# Patient Record
Sex: Male | Born: 1955 | Race: White | Hispanic: No | Smoking: Never smoker
Health system: Southern US, Community
[De-identification: ages and names within clinical notes are randomized; demographics above are authoritative.]

## PROBLEM LIST (undated history)

## (undated) DIAGNOSIS — K648 Other hemorrhoids: Principal | ICD-10-CM

## (undated) DIAGNOSIS — K644 Residual hemorrhoidal skin tags: Principal | ICD-10-CM

## (undated) DIAGNOSIS — E785 Hyperlipidemia, unspecified: Secondary | ICD-10-CM

## (undated) HISTORY — DX: Other hemorrhoids: K64.8

## (undated) HISTORY — DX: Residual hemorrhoidal skin tags: K64.4

---

## 2006-07-06 ENCOUNTER — Encounter: Admission: RE | Admit: 2006-07-06 | Discharge: 2006-07-06 | Payer: Self-pay | Admitting: Internal Medicine

## 2007-08-09 ENCOUNTER — Ambulatory Visit: Payer: Self-pay | Admitting: Internal Medicine

## 2007-08-30 ENCOUNTER — Ambulatory Visit: Payer: Self-pay | Admitting: Internal Medicine

## 2007-08-30 HISTORY — PX: COLONOSCOPY: SHX174

## 2012-12-13 ENCOUNTER — Emergency Department (HOSPITAL_COMMUNITY)
Admission: EM | Admit: 2012-12-13 | Discharge: 2012-12-13 | Disposition: A | Discharge status: Home / Self Care | Payer: BC Managed Care – PPO | Attending: Emergency Medicine | Admitting: Emergency Medicine

## 2012-12-13 ENCOUNTER — Emergency Department (HOSPITAL_COMMUNITY): Payer: BC Managed Care – PPO

## 2012-12-13 ENCOUNTER — Encounter (HOSPITAL_COMMUNITY): Payer: Self-pay | Admitting: Emergency Medicine

## 2012-12-13 DIAGNOSIS — S81009A Unspecified open wound, unspecified knee, initial encounter: Secondary | ICD-10-CM | POA: Insufficient documentation

## 2012-12-13 DIAGNOSIS — Y9389 Activity, other specified: Secondary | ICD-10-CM | POA: Insufficient documentation

## 2012-12-13 DIAGNOSIS — Z79899 Other long term (current) drug therapy: Secondary | ICD-10-CM | POA: Insufficient documentation

## 2012-12-13 DIAGNOSIS — Y929 Unspecified place or not applicable: Secondary | ICD-10-CM | POA: Insufficient documentation

## 2012-12-13 DIAGNOSIS — S91009A Unspecified open wound, unspecified ankle, initial encounter: Secondary | ICD-10-CM | POA: Insufficient documentation

## 2012-12-13 DIAGNOSIS — Z23 Encounter for immunization: Secondary | ICD-10-CM | POA: Insufficient documentation

## 2012-12-13 DIAGNOSIS — IMO0002 Reserved for concepts with insufficient information to code with codable children: Secondary | ICD-10-CM | POA: Insufficient documentation

## 2012-12-13 DIAGNOSIS — E785 Hyperlipidemia, unspecified: Secondary | ICD-10-CM | POA: Insufficient documentation

## 2012-12-13 HISTORY — DX: Hyperlipidemia, unspecified: E78.5

## 2012-12-13 MED ORDER — CEPHALEXIN 500 MG PO CAPS: 500.0000 mg | ORAL_CAPSULE | Freq: Four times a day (QID) | ORAL | Status: DC

## 2012-12-13 MED ORDER — SULFAMETHOXAZOLE-TRIMETHOPRIM 800-160 MG PO TABS: 1.0000 | ORAL_TABLET | Freq: Two times a day (BID) | ORAL | Status: DC

## 2012-12-13 MED ORDER — TETANUS-DIPHTH-ACELL PERTUSSIS 5-2.5-18.5 LF-MCG/0.5 IM SUSP
0.5000 mL | Freq: Once | INTRAMUSCULAR | Status: AC
  Administered 2012-12-13: 0.5 mL via INTRAMUSCULAR
  Filled 2012-12-13: qty 0.5

## 2012-12-13 NOTE — ED Notes (Signed)
Patient states that he was working with a chainsaw and he cut his left knee. The patient has a 2 inch laceration to his left knee. The bleeding is controlled

## 2012-12-13 NOTE — ED Provider Notes (Signed)
History    CSN: [id number] Arrival date & time 12/13/12  1220  First MD Initiated Contact with Patient 12/13/12 1402     Chief Complaint  Patient presents with  . Laceration   (Consider location/radiation/quality/duration/timing/severity/associated sxs/prior Treatment) Patient is a 57 y.o. male presenting with skin laceration. The history is provided by the patient. No language interpreter was used.  Laceration Reginald Gill is a 57 y/o M with PMHx of HLD presenting to the ED with laceration to the left knee that occurred today at approximately 11:45AM, patient reported that he was using a chainsaw and he was leaning over with the chainsaw still on and the chainsaw kicked back leading to a laceration to the left knee. Patient reported mild discomfort, 3/10, pain described as a constant ache, without radiation. Bleeding controlled. Denied use of medications. Denied numbness, tingling.   Past Medical History  Diagnosis Date  . Hyperlipidemia    History reviewed. No pertinent past surgical history. History reviewed. No pertinent family history. History  Substance Use Topics  . Smoking status: Never Smoker   . Smokeless tobacco: Not on file  . Alcohol Use: Yes     Comment: occasionally    Review of Systems  Constitutional: Negative for fever and chills.  HENT: Negative for neck pain.   Skin: Positive for wound.  Neurological: Negative for dizziness, weakness and numbness.  All other systems reviewed and are negative.    Allergies  Review of patient's allergies indicates no known allergies.  Home Medications   Current Outpatient Rx  Name  Route  Sig  Dispense  Refill  . calcium-vitamin D (OSCAL WITH D) 500-200 MG-UNIT per tablet   Oral   Take 1 tablet by mouth daily.         . Multiple Vitamin (MULTIVITAMIN WITH MINERALS) TABS   Oral   Take 1 tablet by mouth daily.         . rosuvastatin (CRESTOR) 5 MG tablet   Oral   Take 5 mg by mouth 4 (four) times a  week.         . cephALEXin (KEFLEX) 500 MG capsule   Oral   Take 1 capsule (500 mg total) by mouth 4 (four) times daily.   20 capsule   0   . sulfamethoxazole-trimethoprim (BACTRIM DS,SEPTRA DS) 800-160 MG per tablet   Oral   Take 1 tablet by mouth 2 (two) times daily. One po bid x 7 days   14 tablet   0    BP 122/83  Pulse 57  Temp(Src) 98.5 F (36.9 C) (Oral)  Resp 15  SpO2 100% Physical Exam  Nursing note and vitals reviewed. Constitutional: He is oriented to person, place, and time. He appears well-developed and well-nourished. No distress.  HENT:  Head: Normocephalic and atraumatic.  Eyes:  Eyes normal  Neck: Normal range of motion. Neck supple.  Cardiovascular:  Pulses:      Dorsalis pedis pulses are 2+ on the right side, and 2+ on the left side.  Musculoskeletal: Normal range of motion. He exhibits tenderness (mild discomfort to palpation of the anterior aspect of the left knee secondary to laceration). He exhibits no edema.  Full flexion and extension of left knee - negative pain  Strength 5+/5+ to lower extremities bilaterally, with resistance.   Neurological: He is alert and oriented to person, place, and time. He exhibits normal muscle tone. Coordination normal.  Sensation to lower extremities, bilaterally, with differentiation to sharp and dull touch.  Skin: Skin is warm and dry. No rash noted. He is not diaphoretic. No erythema.  Approximately, 2-2.5 inch laceration to the left knee  Psychiatric: He has a normal mood and affect. His behavior is normal. Thought content normal.    ED Course  Procedures (including critical care time) LACERATION REPAIR Performed by: Raymon Mutton Authorized by: Raymon Mutton Consent: Verbal consent obtained. Risks and benefits: risks, benefits and alternatives were discussed Consent given by: patient Patient identity confirmed: provided demographic data Prepped and Draped in normal sterile fashion Wound  explored  Laceration Location: left knee  Laceration Length: 2 -2.5 inches  No Foreign Bodies seen or palpated  Anesthesia: local infiltration  Local anesthetic: lidocaine 2% without epinephrine  Anesthetic total: 6 ml  Irrigation method: syringe Amount of cleaning: standard  Skin closure: approximate  Number of sutures: 6  Technique: horizontal mattress, 3-0 prolene  Patient tolerance: Patient tolerated the procedure well with no immediate complications. No foreign bodies noted. Wound cleaned thoroughly and thoroughly irrigated. Mid debridement required for removal of damaged tissues.    Labs Reviewed - No data to display Dg Knee Complete 4 Views Left  12/13/2012   *RADIOLOGY REPORT*  Clinical Data: Laceration left anterior knee.  LEFT KNEE - COMPLETE 4+ VIEW  Comparison: None.  Findings: Infrapatellar bandaging noted.  Skin defect anterior to the inferior pole of the patella likely represents the described laceration.  No patella although or knee effusion.  No disruption of fat planes between Hoffa's fat pad and the patellar tendon.  No other foreign body or bony abnormality.  IMPRESSION:  1.  Laceration anterior to the patellar tendon.  Aside from the bandaging, I do not see a foreign body.  No acute bony findings or knee effusion.   Original Report Authenticated By: Gaylyn Rong, M.D.   1. Laceration     MDM  Patient presenting to the ED with laceration to the left knee secondary to chainsaw use. Xray negative for foreign body. Tetanus booster administered. Negative neurovascular damage noted. Full flexion and extension of left knee without pain. Closed with 6 horizontal mattress sutures using 3-0 prolene - patient tolerated procedure well. Thoroughly cleaned and irrigated. Negative foreign body noted, negative involvement of tendon and deep tendons, proper hemostasis noted. Patient discharged. Discharged patient with antibiotics. Discussed with patient wound care.  Discussed with patient to return to ED within 7 days for removal of sutures. Discussed with patient to refrain from strenuous activity for this could aggravate the sutures. Discussed with patient to continue to monitor symptoms and if symptoms are to worsen or change to report back to the ED - strict return instructions given. Patient agreed to plan of care, understood, all questions answered.   Raymon Mutton, PA-C 12/13/12 1749

## 2012-12-17 NOTE — ED Provider Notes (Signed)
Medical screening examination/treatment/procedure(s) were performed by non-physician practitioner and as supervising physician I was immediately available for consultation/collaboration.   Richardean Canal, MD 12/17/12 [phone]

## 2012-12-21 ENCOUNTER — Encounter (HOSPITAL_COMMUNITY): Payer: Self-pay

## 2012-12-21 ENCOUNTER — Emergency Department (HOSPITAL_COMMUNITY)
Admission: EM | Admit: 2012-12-21 | Discharge: 2012-12-21 | Disposition: A | Discharge status: Home / Self Care | Payer: BC Managed Care – PPO | Attending: Emergency Medicine | Admitting: Emergency Medicine

## 2012-12-21 DIAGNOSIS — Z792 Long term (current) use of antibiotics: Secondary | ICD-10-CM | POA: Insufficient documentation

## 2012-12-21 DIAGNOSIS — Z4802 Encounter for removal of sutures: Secondary | ICD-10-CM | POA: Insufficient documentation

## 2012-12-21 DIAGNOSIS — E785 Hyperlipidemia, unspecified: Secondary | ICD-10-CM | POA: Insufficient documentation

## 2012-12-21 DIAGNOSIS — Z79899 Other long term (current) drug therapy: Secondary | ICD-10-CM | POA: Insufficient documentation

## 2012-12-21 NOTE — ED Provider Notes (Signed)
   History    CSN: [id number] Arrival date & time 12/21/12  4098  First MD Initiated Contact with Patient 12/21/12 [phone]     Chief Complaint  Patient presents with  . Suture / Staple Removal  suture removal (Consider location/radiation/quality/duration/timing/severity/associated sxs/prior Treatment) HPI Here for planned left knee lac suture removal; no concerns; no fever/redness/pain/pus/weak/numb/dehiscence. Past Medical History  Diagnosis Date  . Hyperlipidemia    History reviewed. No pertinent past surgical history. History reviewed. No pertinent family history. History  Substance Use Topics  . Smoking status: Never Smoker   . Smokeless tobacco: Not on file  . Alcohol Use: Yes     Comment: occasionally    Review of Systems See HPI Allergies  Review of patient's allergies indicates no known allergies.  Home Medications   Current Outpatient Rx  Name  Route  Sig  Dispense  Refill  . calcium-vitamin D (OSCAL WITH D) 500-200 MG-UNIT per tablet   Oral   Take 1 tablet by mouth daily.         . cephALEXin (KEFLEX) 500 MG capsule   Oral   Take 1 capsule (500 mg total) by mouth 4 (four) times daily.   20 capsule   0   . Multiple Vitamin (MULTIVITAMIN WITH MINERALS) TABS   Oral   Take 1 tablet by mouth daily.         . rosuvastatin (CRESTOR) 5 MG tablet   Oral   Take 5 mg by mouth 4 (four) times a week.         . sulfamethoxazole-trimethoprim (BACTRIM DS,SEPTRA DS) 800-160 MG per tablet   Oral   Take 1 tablet by mouth 2 (two) times daily. One po bid x 7 days   14 tablet   0    BP 110/64  Pulse 68  Temp(Src) 97.8 F (36.6 C) (Oral)  Resp 16  SpO2 100% Physical Exam Awake, alert nontoxic, normal speech and gait. Left knee well-healing lac no tenderness/pus/erythema/dehiscence, good ROM left knee, normal LT left leg ED Course  Procedures (including critical care time) RN removed sutures. Labs Reviewed - No data to display No results found. 1. Visit  for suture removal     MDM  Doubt wound infection.  Hurman Horn, MD 12/21/12 2219

## 2012-12-21 NOTE — ED Notes (Signed)
He states he is here for suture removal (from left ant. Knee).  Wound is clean and healing.

## 2013-04-29 ENCOUNTER — Encounter: Payer: Self-pay | Admitting: Internal Medicine

## 2013-06-03 ENCOUNTER — Encounter: Payer: Self-pay | Admitting: Internal Medicine

## 2013-06-03 ENCOUNTER — Ambulatory Visit (INDEPENDENT_AMBULATORY_CARE_PROVIDER_SITE_OTHER): Payer: BC Managed Care – PPO | Admitting: Internal Medicine

## 2013-06-03 VITALS — BP 102/70 | HR 76 | Ht 72.0 in | Wt 175.2 lb

## 2013-06-03 DIAGNOSIS — R195 Other fecal abnormalities: Secondary | ICD-10-CM | POA: Insufficient documentation

## 2013-06-03 MED ORDER — NA SULFATE-K SULFATE-MG SULF 17.5-3.13-1.6 GM/177ML PO SOLN: ORAL | Status: DC

## 2013-06-03 NOTE — Assessment & Plan Note (Signed)
This is occurring 5 years after a normal colonoscopy. Repeat colonoscopy is prudent.The risks and benefits as well as alternatives of endoscopic procedure(s) have been discussed and reviewed. All questions answered. The patient agrees to proceed.

## 2013-06-03 NOTE — Progress Notes (Addendum)
         Subjective:    Patient ID: Reginald Gill, male    DOB: 13-Dec-1955, 57 y.o.   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 In 2009 a colonoscopy was normal. He has no GI symptoms at this time. No Known Allergies Outpatient Prescriptions Prior to Visit  Medication Sig Dispense Refill  . calcium-vitamin D (OSCAL WITH D) 500-200 MG-UNIT per tablet Take 1 tablet by mouth daily.      . Multiple Vitamin (MULTIVITAMIN WITH MINERALS) TABS Take 1 tablet by mouth daily.      . rosuvastatin (CRESTOR) 5 MG tablet Take 5 mg by mouth 4 (four) times a week.      . cephALEXin (KEFLEX) 500 MG capsule Take 1 capsule (500 mg total) by mouth 4 (four) times daily.  20 capsule  0  . sulfamethoxazole-trimethoprim (BACTRIM DS,SEPTRA DS) 800-160 MG per tablet Take 1 tablet by mouth 2 (two) times daily. One po bid x 7 days  14 tablet  0   No facility-administered medications prior to visit.   Past Medical History  Diagnosis Date  . Hyperlipidemia    Past Surgical History  Procedure Laterality Date  . Colonoscopy  08/30/2007    Dr. Stan Head   History   Social History  . Marital Status: Married    Spouse Name: Huntley Dec    Number of Children: N0  . Years of Education: DDS   Social History Main Topics  . Smoking status: Never Smoker   . Smokeless tobacco: Never Used  . Alcohol Use: Yes     Comment: occasionally  . 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   No caffeine.   Family History  Problem Relation Age of Onset  . Ovarian cancer Mother     Review of Systems Entirely negative.    Objective:   Physical Exam Well-nourished no acute distress  Data review labs from 03/28/2013 show a normal CBC with hemoglobin 16.2 and MCV of 96.1. Comprehensive metabolic panel is normal. TSH normal PSA normal.    Assessment & Plan:   1. Heme + stool-iFOBT    I appreciate the opportunity  to care for this patient. CC: Ezequiel Kayser, MD

## 2013-06-03 NOTE — Patient Instructions (Signed)

## 2013-06-04 ENCOUNTER — Encounter: Payer: Self-pay | Admitting: Internal Medicine

## 2013-06-18 ENCOUNTER — Encounter: Payer: Self-pay | Admitting: Internal Medicine

## 2013-06-18 ENCOUNTER — Ambulatory Visit (AMBULATORY_SURGERY_CENTER): Payer: BC Managed Care – PPO | Admitting: Internal Medicine

## 2013-06-18 VITALS — BP 126/74 | HR 59 | Temp 96.9°F | Resp 36 | Ht 72.0 in | Wt 175.0 lb

## 2013-06-18 DIAGNOSIS — R195 Other fecal abnormalities: Secondary | ICD-10-CM

## 2013-06-18 MED ORDER — SODIUM CHLORIDE 0.9 % IV SOLN: 500.0000 mL | INTRAVENOUS | Status: DC

## 2013-06-18 NOTE — Patient Instructions (Addendum)
The colonoscopy was normal. I suspect some anorectal irritation caused the heme + stool.  Prostate was normal.  Next routine colonoscopy in 10 years - 2024. I would avoid routine hemoccults for at least 5 years if not 10.  I appreciate the opportunity to care for you. Iva Boop, MD, FACG  YOU HAD AN ENDOSCOPIC PROCEDURE TODAY AT THE [facility]: Refer to the procedure report that was given to you for any specific questions about what was found during the examination.  If the procedure report does not answer your questions, please call your gastroenterologist to clarify.  If you requested that your care partner not be given the details of your procedure findings, then the procedure report has been included in a sealed envelope for you to review at your convenience later.  YOU SHOULD EXPECT: Some feelings of bloating in the abdomen. Passage of more gas than usual.  Walking can help get rid of the air that was put into your GI tract during the procedure and reduce the bloating. If you had a lower endoscopy (such as a colonoscopy or flexible sigmoidoscopy) you may notice spotting of blood in your stool or on the toilet paper. If you underwent a bowel prep for your procedure, then you may not have a normal bowel movement for a few days.  DIET: Your first meal following the procedure should be a light meal and then it is ok to progress to your normal diet.  A half-sandwich or bowl of soup is an example of a good first meal.  Heavy or fried foods are harder to digest and may make you feel nauseous or bloated.  Likewise meals heavy in dairy and vegetables can cause extra gas to form and this can also increase the bloating.  Drink plenty of fluids but you should avoid alcoholic beverages for 24 hours.  ACTIVITY: Your care partner should take you home directly after the procedure.  You should plan to take it easy, moving slowly for the rest of the day.  You can resume normal activity the day  after the procedure however you should NOT DRIVE or use heavy machinery for 24 hours (because of the sedation medicines used during the test).    SYMPTOMS TO REPORT IMMEDIATELY: A gastroenterologist can be reached at any hour.  During normal business hours, 8:30 AM to 5:00 PM Monday through Friday, call [phone].  After hours and on weekends, please call the GI answering service at [phone] who will take a message and have the physician on call contact you.   Following lower endoscopy (colonoscopy or flexible sigmoidoscopy):  Excessive amounts of blood in the stool  Significant tenderness or worsening of abdominal pains  Swelling of the abdomen that is new, acute  Fever of 100F or higher  Following upper endoscopy (EGD)  Vomiting of blood or coffee ground material  New chest pain or pain under the shoulder blades  Painful or persistently difficult swallowing  New shortness of breath  Fever of 100F or higher  Black, tarry-looking stools  FOLLOW UP: If any biopsies were taken you will be contacted by phone or by letter within the next 1-3 weeks.  Call your gastroenterologist if you have not heard about the biopsies in 3 weeks.  Our staff will call the home number listed on your records the next business day following your procedure to check on you and address any questions or concerns that you may have at that time regarding the information  given to you following your procedure. This is a courtesy call and so if there is no answer at the home number and we have not heard from you through the emergency physician on call, we will assume that you have returned to your regular daily activities without incident.  SIGNATURES/CONFIDENTIALITY: You and/or your care partner have signed paperwork which will be entered into your electronic medical record.  These signatures attest to the fact that that the information above on your After Visit Summary has been reviewed and is understood.   Full responsibility of the confidentiality of this discharge information lies with you and/or your care-partner.

## 2013-06-18 NOTE — Progress Notes (Signed)
Stable to RR 

## 2013-06-18 NOTE — Op Note (Signed)
[facility] [address] [place], [address]   COLONOSCOPY PROCEDURE REPORT  PATIENT: Reginald Gill, Reginald Gill  MR#: [id number] BIRTHDATE: 1955/09/13 , 57  yrs. old GENDER: Male ENDOSCOPIST: Iva Boop, MD, [facility] PROCEDURE DATE:  06/18/2013 PROCEDURE:   Colonoscopy, diagnostic First Screening Colonoscopy - Avg.  risk and is 50 yrs.  old or older - No.  Prior Negative Screening - Now for repeat screening. Other: See Comments  History of Adenoma - Now for follow-up colonoscopy & has been > or = to 3 yrs.  N/A  Polyps Removed Today? No.  Recommend repeat exam, <10 yrs? No. ASA CLASS:   Class I INDICATIONS:heme-positive stool. MEDICATIONS: propofol (Diprivan) 300mg  IV, MAC sedation, administered by CRNA, and These medications were titrated to patient response per physician's verbal order  DESCRIPTION OF PROCEDURE:   After the risks benefits and alternatives of the procedure were thoroughly explained, informed consent was obtained.  A digital rectal exam revealed no abnormalities of the rectum, A digital rectal exam revealed no prostatic nodules, and A digital rectal exam revealed the prostate was not enlarged.   The LB [id number]  endoscope was introduced through the anus and advanced to the cecum, which was identified by both the appendix and ileocecal valve. No adverse events experienced.   The quality of the prep was excellent using Suprep  The instrument was then slowly withdrawn as the colon was fully examined.      COLON FINDINGS: A normal appearing cecum, ileocecal valve, and appendiceal orifice were identified.  The ascending, hepatic flexure, transverse, splenic flexure, descending, sigmoid colon and rectum appeared unremarkable.  No polyps or cancers were seen.   A right colon retroflexion was performed.  Retroflexed views revealed no abnormalities. The time to cecum=3 minutes 11 seconds. Withdrawal time=11 minutes 10 seconds.  The scope was  withdrawn and the procedure completed. COMPLICATIONS: There were no complications.  ENDOSCOPIC IMPRESSION: Normal colonoscopy with excellent prep. Normal prostate exam  RECOMMENDATIONS: Repeat colonoscopy 10 years - 2024   eSigned:  Iva Boop, MD, [facility] 06/18/2013 8:42 AM   cc: Rodrigo Ran, MD and The Patient

## 2013-06-20 ENCOUNTER — Telehealth: Payer: Self-pay | Admitting: *Deleted

## 2013-06-20 NOTE — Telephone Encounter (Signed)
No answer, unable to leave a message. °

## 2013-06-20 NOTE — Telephone Encounter (Signed)
No answer, message left for the patient. 

## 2015-10-08 DIAGNOSIS — H40013 Open angle with borderline findings, low risk, bilateral: Secondary | ICD-10-CM | POA: Diagnosis not present

## 2015-10-08 DIAGNOSIS — H11153 Pinguecula, bilateral: Secondary | ICD-10-CM | POA: Diagnosis not present

## 2015-10-08 DIAGNOSIS — H21233 Degeneration of iris (pigmentary), bilateral: Secondary | ICD-10-CM | POA: Diagnosis not present

## 2015-12-08 DIAGNOSIS — E784 Other hyperlipidemia: Secondary | ICD-10-CM | POA: Diagnosis not present

## 2015-12-08 DIAGNOSIS — E785 Hyperlipidemia, unspecified: Secondary | ICD-10-CM | POA: Diagnosis not present

## 2016-03-22 DIAGNOSIS — Z23 Encounter for immunization: Secondary | ICD-10-CM | POA: Diagnosis not present

## 2016-05-30 DIAGNOSIS — D1801 Hemangioma of skin and subcutaneous tissue: Secondary | ICD-10-CM | POA: Diagnosis not present

## 2016-05-30 DIAGNOSIS — D2362 Other benign neoplasm of skin of left upper limb, including shoulder: Secondary | ICD-10-CM | POA: Diagnosis not present

## 2016-05-30 DIAGNOSIS — Z85828 Personal history of other malignant neoplasm of skin: Secondary | ICD-10-CM | POA: Diagnosis not present

## 2016-05-30 DIAGNOSIS — D2261 Melanocytic nevi of right upper limb, including shoulder: Secondary | ICD-10-CM | POA: Diagnosis not present

## 2016-05-31 DIAGNOSIS — M5416 Radiculopathy, lumbar region: Secondary | ICD-10-CM | POA: Diagnosis not present

## 2016-06-05 DIAGNOSIS — M545 Low back pain: Secondary | ICD-10-CM | POA: Diagnosis not present

## 2016-06-07 DIAGNOSIS — M5416 Radiculopathy, lumbar region: Secondary | ICD-10-CM | POA: Diagnosis not present

## 2016-06-14 DIAGNOSIS — M5416 Radiculopathy, lumbar region: Secondary | ICD-10-CM | POA: Diagnosis not present

## 2016-06-21 DIAGNOSIS — M5416 Radiculopathy, lumbar region: Secondary | ICD-10-CM | POA: Diagnosis not present

## 2016-06-23 DIAGNOSIS — Z0000 Encounter for general adult medical examination without abnormal findings: Secondary | ICD-10-CM | POA: Diagnosis not present

## 2016-06-23 DIAGNOSIS — Z125 Encounter for screening for malignant neoplasm of prostate: Secondary | ICD-10-CM | POA: Diagnosis not present

## 2016-06-23 DIAGNOSIS — R946 Abnormal results of thyroid function studies: Secondary | ICD-10-CM | POA: Diagnosis not present

## 2016-06-23 DIAGNOSIS — E784 Other hyperlipidemia: Secondary | ICD-10-CM | POA: Diagnosis not present

## 2016-06-28 DIAGNOSIS — M5416 Radiculopathy, lumbar region: Secondary | ICD-10-CM | POA: Diagnosis not present

## 2016-06-30 DIAGNOSIS — R946 Abnormal results of thyroid function studies: Secondary | ICD-10-CM | POA: Diagnosis not present

## 2016-06-30 DIAGNOSIS — E784 Other hyperlipidemia: Secondary | ICD-10-CM | POA: Diagnosis not present

## 2016-06-30 DIAGNOSIS — Z0000 Encounter for general adult medical examination without abnormal findings: Secondary | ICD-10-CM | POA: Diagnosis not present

## 2016-06-30 DIAGNOSIS — Z1389 Encounter for screening for other disorder: Secondary | ICD-10-CM | POA: Diagnosis not present

## 2016-06-30 DIAGNOSIS — C4491 Basal cell carcinoma of skin, unspecified: Secondary | ICD-10-CM | POA: Diagnosis not present

## 2016-06-30 DIAGNOSIS — M545 Low back pain: Secondary | ICD-10-CM | POA: Diagnosis not present

## 2016-07-24 DIAGNOSIS — L570 Actinic keratosis: Secondary | ICD-10-CM | POA: Diagnosis not present

## 2017-02-26 ENCOUNTER — Telehealth: Payer: Self-pay | Admitting: Internal Medicine

## 2017-02-26 NOTE — Telephone Encounter (Signed)
Subject: Non-Urgent Medical Question             Reginald Gill,    Reginald Gill writing not Reginald Gill. I have not signed up for my chart yet. The past 4 days (since Friday morning), after a bowel movement I have noticed a slight amount of red blood on the toilet paper after a wipe. There was a very small amount on a portion of the stool also. I have take naproxen 220 mg tablets and occasionally a ASA 325mg  tablet periodically over the past week or so for lower back pain.  Maximum naproxen daily dose was four tablets(usually two tablets).  Please advise me on next course of action (office visit, PA is fine with me)? Contact # either [phone](cell) or [phone] (home)    Thanks for your help.    Reginald Gill   Call end explain I think he is having benign anorectal bleeding - could be aggravated by NSAID's  Should stop those if he can though if he needs them for his back ok with this level of bleeding.  Please Rx hydrocortisone cream 2.5% # 30 g use bid x 7 days - I suspect he has hemorrhoids bleeding.  We can get him an appointment w/ me or an app in next 1-2 weeks - if we have a soon appointment could wait until he is seen to Rx I.e. If he is coming in next 3 days would not Rx  Thanks

## 2017-02-26 NOTE — Telephone Encounter (Signed)
Patient notified  He will come in on Wed at 10:30.  Will hold off on RX

## 2017-02-26 NOTE — Telephone Encounter (Signed)
Left message for patient to call back  

## 2017-02-28 ENCOUNTER — Encounter: Payer: Self-pay | Admitting: Internal Medicine

## 2017-02-28 ENCOUNTER — Ambulatory Visit (INDEPENDENT_AMBULATORY_CARE_PROVIDER_SITE_OTHER): Payer: Self-pay | Admitting: Internal Medicine

## 2017-02-28 VITALS — BP 100/70 | HR 60 | Ht 71.5 in | Wt 165.6 lb

## 2017-02-28 DIAGNOSIS — K644 Residual hemorrhoidal skin tags: Secondary | ICD-10-CM | POA: Insufficient documentation

## 2017-02-28 DIAGNOSIS — K648 Other hemorrhoids: Secondary | ICD-10-CM

## 2017-02-28 HISTORY — DX: Residual hemorrhoidal skin tags: K64.4

## 2017-02-28 MED ORDER — HYDROCORTISONE 2.5 % RE CREA: 1.0000 "application " | TOPICAL_CREAM | Freq: Two times a day (BID) | RECTAL | 1 refills | Status: DC | PRN

## 2017-02-28 NOTE — Patient Instructions (Signed)
   You have bleeding hemorrhoids.  Try the hydrocortisone cream as needed.  Call back as needed.  I appreciate the opportunity to care for you. Iva Booparl E. Elyshia Kumagai, MD, Clementeen GrahamFACG

## 2017-02-28 NOTE — Progress Notes (Signed)
   Reginald CallasDavid Gill 60 y.o. 11/04/1955 [id number]  Assessment & Plan:   Encounter Diagnosis  Name Primary?  . Internal and external bleeding hemorrhoids Yes    We'll treat with as needed hydrocortisone cream. Why these flared is not entirely clear. Does not seem to need to do anything for bowel habits etc. I will see him back as needed.  I appreciate the opportunity to care for this patient. 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 Lately he has seen some bright red blood with wiping. No rectal pain no change in bowel habits in fact defecation is easy and without difficulty. Colonoscopy in 2014 was normal as was one in 2009. 2014 exam was because of a heme positive stool found on screening testing. No Known Allergies Current Meds  Medication Sig  . Multiple Vitamin (MULTIVITAMIN WITH MINERALS) TABS Take 1 tablet by mouth daily.  . naproxen (NAPROSYN) 250 MG tablet Take 250 mg by mouth as needed.  . rosuvastatin (CRESTOR) 5 MG tablet Take 5 mg by mouth 4 (four) times a week.   Past Medical History:  Diagnosis Date  . Hyperlipidemia    Past Surgical History:  Procedure Laterality Date  . COLONOSCOPY  08/30/2007   Dr. Stan Headarl Gessner   Review of Systems As per history of present illness  Objective:   Physical Exam  Rectal Normal tone no mass brown stool nontender Anoscopy Grade 1 internal and external hemorrhoids all positions with mild inflammation changes i.e. erythematous

## 2017-03-12 DIAGNOSIS — Z23 Encounter for immunization: Secondary | ICD-10-CM | POA: Diagnosis not present

## 2017-05-29 DIAGNOSIS — D1801 Hemangioma of skin and subcutaneous tissue: Secondary | ICD-10-CM | POA: Diagnosis not present

## 2017-05-29 DIAGNOSIS — L812 Freckles: Secondary | ICD-10-CM | POA: Diagnosis not present

## 2017-05-29 DIAGNOSIS — Z85828 Personal history of other malignant neoplasm of skin: Secondary | ICD-10-CM | POA: Diagnosis not present

## 2017-05-29 DIAGNOSIS — L821 Other seborrheic keratosis: Secondary | ICD-10-CM | POA: Diagnosis not present

## 2017-06-22 DIAGNOSIS — M25562 Pain in left knee: Secondary | ICD-10-CM | POA: Diagnosis not present

## 2017-07-04 DIAGNOSIS — M25562 Pain in left knee: Secondary | ICD-10-CM | POA: Diagnosis not present

## 2017-07-05 ENCOUNTER — Emergency Department (HOSPITAL_COMMUNITY): Payer: BLUE CROSS/BLUE SHIELD

## 2017-07-05 ENCOUNTER — Encounter (HOSPITAL_COMMUNITY)
Admission: EM | Disposition: A | Discharge status: Home / Self Care | Payer: Self-pay | Source: Home / Self Care | Attending: Emergency Medicine

## 2017-07-05 ENCOUNTER — Ambulatory Visit (HOSPITAL_COMMUNITY)
Admission: EM | Admit: 2017-07-05 | Discharge: 2017-07-05 | Disposition: A | Discharge status: Home / Self Care | Payer: BLUE CROSS/BLUE SHIELD | Attending: Emergency Medicine | Admitting: Emergency Medicine

## 2017-07-05 ENCOUNTER — Encounter (HOSPITAL_COMMUNITY): Payer: Self-pay | Admitting: Emergency Medicine

## 2017-07-05 ENCOUNTER — Other Ambulatory Visit: Payer: Self-pay

## 2017-07-05 DIAGNOSIS — E785 Hyperlipidemia, unspecified: Secondary | ICD-10-CM | POA: Insufficient documentation

## 2017-07-05 DIAGNOSIS — Z8041 Family history of malignant neoplasm of ovary: Secondary | ICD-10-CM | POA: Diagnosis not present

## 2017-07-05 DIAGNOSIS — Z79899 Other long term (current) drug therapy: Secondary | ICD-10-CM | POA: Diagnosis not present

## 2017-07-05 DIAGNOSIS — T18108A Unspecified foreign body in esophagus causing other injury, initial encounter: Secondary | ICD-10-CM | POA: Diagnosis not present

## 2017-07-05 DIAGNOSIS — K2970 Gastritis, unspecified, without bleeding: Secondary | ICD-10-CM | POA: Insufficient documentation

## 2017-07-05 DIAGNOSIS — R0989 Other specified symptoms and signs involving the circulatory and respiratory systems: Secondary | ICD-10-CM | POA: Diagnosis not present

## 2017-07-05 DIAGNOSIS — Z8719 Personal history of other diseases of the digestive system: Secondary | ICD-10-CM | POA: Insufficient documentation

## 2017-07-05 DIAGNOSIS — Z9889 Other specified postprocedural states: Secondary | ICD-10-CM | POA: Diagnosis not present

## 2017-07-05 DIAGNOSIS — Z0389 Encounter for observation for other suspected diseases and conditions ruled out: Secondary | ICD-10-CM | POA: Diagnosis not present

## 2017-07-05 DIAGNOSIS — T189XXA Foreign body of alimentary tract, part unspecified, initial encounter: Secondary | ICD-10-CM | POA: Diagnosis not present

## 2017-07-05 HISTORY — PX: ESOPHAGOGASTRODUODENOSCOPY: SHX5428

## 2017-07-05 SURGERY — EGD (ESOPHAGOGASTRODUODENOSCOPY)
Anesthesia: Moderate Sedation

## 2017-07-05 MED ORDER — MIDAZOLAM HCL 5 MG/ML IJ SOLN
INTRAMUSCULAR | Status: AC
  Filled 2017-07-05: qty 2

## 2017-07-05 MED ORDER — BUTAMBEN-TETRACAINE-BENZOCAINE 2-2-14 % EX AERO
INHALATION_SPRAY | CUTANEOUS | Status: DC | PRN
  Administered 2017-07-05: 2 via TOPICAL

## 2017-07-05 MED ORDER — FENTANYL CITRATE (PF) 100 MCG/2ML IJ SOLN
INTRAMUSCULAR | Status: AC
  Filled 2017-07-05: qty 4

## 2017-07-05 MED ORDER — SODIUM CHLORIDE 0.9 % IV SOLN: Freq: Once | INTRAVENOUS | Status: DC

## 2017-07-05 MED ORDER — FENTANYL CITRATE (PF) 100 MCG/2ML IJ SOLN
INTRAMUSCULAR | Status: DC | PRN
  Administered 2017-07-05 (×2): 25 ug via INTRAVENOUS

## 2017-07-05 MED ORDER — MIDAZOLAM HCL 10 MG/2ML IJ SOLN
INTRAMUSCULAR | Status: DC | PRN
  Administered 2017-07-05 (×2): 2 mg via INTRAVENOUS

## 2017-07-05 NOTE — ED Notes (Signed)
ENT cart at bedside, unopened

## 2017-07-05 NOTE — Discharge Instructions (Signed)
It is likely that the toothpick scratched your esophagus causing your discomfort with swallowing.  There is no evidence of this foreign body in your stomach or esophagus on endoscopy today.  It is possible that it may have passed into your small intestine.  Usually swallowed foreign bodies cause little complications once passed into your intestines; however sharp foreign bodies do care a small risk of bowel perforation.  Should you develop worsening abdominal pain, fever, worsening nausea or vomiting, return to the emergency department for evaluation.  We advise the use of dental floss after meals.  Follow up with your primary care doctor as needed.  Return to the ED for any other new or concerning symptoms.

## 2017-07-05 NOTE — ED Notes (Signed)
ENT tech at bedside, fluids hung, patient placed on cardiac monitor.

## 2017-07-05 NOTE — ED Triage Notes (Signed)
Pt reports swallowing wooden toothpick, reports feeling it lodged in esophagus.

## 2017-07-05 NOTE — Consult Note (Signed)
Referring Provider: Bethann BerkshireJoseph Zammit Primary Care Physician:  Rodrigo RanPerini, Mark, MD Primary Gastroenterologist:  Dr. Leone PayorGessner  Reason for Consultation:  Possible esophageal foreign body  HPI: Reginald CallasDavid Gill is a 62 y.o. male with little PMH swallowed a toothpick this morning around 11 am.  He was playing with it in his mouth after lunch and accidentally swallowed it.  Feels it when he swallows now.  Has not had anything else to eat or drink since.  No pain. Just foreign body sensation near sternal notch.  He thinks he can feel something when he flexes his neck fully.  No dyspnea.  No cp.  No neck pain. No prior EGD.  2 past colonoscopies.  CXR in ER unremarkable   Past Medical History:  Diagnosis Date  . Hyperlipidemia   . Internal and external bleeding hemorrhoids 02/28/2017    Past Surgical History:  Procedure Laterality Date  . COLONOSCOPY  08/30/2007   Dr. Stan Headarl Gessner    Prior to Admission medications   Medication Sig Start Date End Date Taking? Authorizing Provider  Multiple Vitamin (MULTIVITAMIN WITH MINERALS) TABS Take 1 tablet by mouth daily.   Yes [provider]  naproxen (NAPROSYN) 250 MG tablet Take 250 mg by mouth as needed.   Yes [provider]  rosuvastatin (CRESTOR) 5 MG tablet Take 5 mg by mouth daily.    Yes [provider]  hydrocortisone (ANUSOL-HC) 2.5 % rectal cream Place 1 application rectally 2 (two) times daily as needed for hemorrhoids. Patient not taking: Reported on 07/05/2017 02/28/17   Iva BoopGessner, Carl E, MD    Current Facility-Administered Medications  Medication Dose Route Frequency Provider Last Rate Last Dose  . 0.9 %  sodium chloride infusion   Intravenous Once Antony MaduraHumes, Kelly, PA-C       Current Outpatient Medications  Medication Sig Dispense Refill  . Multiple Vitamin (MULTIVITAMIN WITH MINERALS) TABS Take 1 tablet by mouth daily.    . naproxen (NAPROSYN) 250 MG tablet Take 250 mg by mouth as needed.    . rosuvastatin  (CRESTOR) 5 MG tablet Take 5 mg by mouth daily.     . hydrocortisone (ANUSOL-HC) 2.5 % rectal cream Place 1 application rectally 2 (two) times daily as needed for hemorrhoids. (Patient not taking: Reported on 07/05/2017) 30 g 1    Allergies as of 07/05/2017  . (No Known Allergies)    Family History  Problem Relation Age of Onset  . Ovarian cancer Mother     Social History   Socioeconomic History  . Marital status: Married    Spouse name: Not on file  . Number of children: 0  . Years of education: Not on file  . Highest education level: Not on file  Social Needs  . Financial resource strain: Not on file  . Food insecurity - worry: Not on file  . Food insecurity - inability: Not on file  . Transportation needs - medical: Not on file  . Transportation needs - non-medical: Not on file  Occupational History  . Occupation: dentist  Tobacco Use  . Smoking status: Never Smoker  . Smokeless tobacco: Never Used  Substance and Sexual Activity  . Alcohol use: Yes    Comment: occasionally  . Drug use: No  . Sexual activity: Not on file  Other Topics Concern  . 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   02/28/2017    Review of Systems: As per HPI,  otherwise negative  Physical Exam: Vital signs in last 24 hours: Temp:  [98.3 F (36.8 C)-98.7 F (37.1 C)] 98.7 F (37.1 C) (01/17 1843) Pulse Rate:  [17-65] 65 (01/17 1945) Resp:  [14-16] 16 (01/17 1843) BP: (115-127)/(71-83) 127/83 (01/17 1945) SpO2:  [97 %-100 %] 100 % (01/17 1945)   Gen: awake, alert, NAD HEENT: anicteric, op clear CV: RRR, no mrg Pulm: CTA b/l Abd: soft, NT/ND, +BS throughout Ext: no c/c/e Neuro: nonfocal   Studies/Results: Dg Chest 2 View  Result Date: 07/05/2017 CLINICAL DATA:  Ingested toothpick. EXAM: CHEST  2 VIEW COMPARISON:  None. FINDINGS: The heart size and mediastinal contours are within normal limits. Both lungs are clear. The  visualized skeletal structures are unremarkable. IMPRESSION: No radiopaque foreign body.  No active cardiopulmonary disease. Electronically Signed   By: Obie Dredge M.D.   On: 07/05/2017 14:45    IMPRESSION:  62 yo male presenting after swallowing a toothpick now with foreign body sensation in upper chest/proximal esophagus  PLAN: 1. EGD - bedside in the ER to exclude foreign body and remove if present. The nature of the procedure, as well as the risks, benefits, and alternatives were carefully and thoroughly reviewed with the patient. Ample time for discussion and questions allowed. The patient understood, was satisfied, and agreed to proceed.      Carie Caddy Hadleigh Felber  07/05/2017, 8:57 PM  Pager number [phone]

## 2017-07-05 NOTE — ED Provider Notes (Signed)
[facility] EMERGENCY DEPARTMENT Provider Note   CSN: [id number] Arrival date & time: 07/05/17  1303     History   Chief Complaint Chief Complaint  Patient presents with  . Swallowed Foreign Body    HPI Reginald Gill is a 62 y.o. male.  62 year old male, who is a local area dentist, presents to the emergency department for evaluation of foreign body sensation.  He states that he was chewing on a toothpick earlier today and accidentally swallowed it.  He reports initially feeling the sensation in his posterior throat.  It has since migrated to his lower neck.  He has not eaten or drank since the incident at ~11AM, but has been able to tolerate secretions without difficulty.  He has no complaints of shortness of breath.  No chest pain, vomiting. Patient is followed by Dr. Leone Payor of [facility].      Past Medical History:  Diagnosis Date  . Hyperlipidemia   . Internal and external bleeding hemorrhoids 02/28/2017    Patient Active Problem List   Diagnosis Date Noted  . Esophageal foreign body, initial encounter   . Internal and external bleeding hemorrhoids 02/28/2017    Past Surgical History:  Procedure Laterality Date  . COLONOSCOPY  08/30/2007   Dr. Stan Head       Home Medications    Prior to Admission medications   Medication Sig Start Date End Date Taking? Authorizing Provider  Multiple Vitamin (MULTIVITAMIN WITH MINERALS) TABS Take 1 tablet by mouth daily.   Yes [provider]  naproxen (NAPROSYN) 250 MG tablet Take 250 mg by mouth as needed.   Yes [provider]  rosuvastatin (CRESTOR) 5 MG tablet Take 5 mg by mouth daily.    Yes [provider]  hydrocortisone (ANUSOL-HC) 2.5 % rectal cream Place 1 application rectally 2 (two) times daily as needed for hemorrhoids. Patient not taking: Reported on 07/05/2017 02/28/17   Iva Boop, MD    Family History Family History  Problem Relation Age of Onset    . Ovarian cancer Mother     Social History Social History   Tobacco Use  . Smoking status: Never Smoker  . Smokeless tobacco: Never Used  Substance Use Topics  . Alcohol use: Yes    Comment: occasionally  . Drug use: No     Allergies   Patient has no known allergies.   Review of Systems Review of Systems Ten systems reviewed and are negative for acute change, except as noted in the HPI.    Physical Exam Updated Vital Signs BP 117/76 (BP Location: Right Arm)   Pulse (!) 59   Temp 98.7 F (37.1 C) (Oral)   Resp 14   SpO2 97%   Physical Exam  Constitutional: He is oriented to person, place, and time. He appears well-developed and well-nourished. No distress.  Nontoxic appearing and in NAD  HENT:  Head: Normocephalic and atraumatic.  No visible FB in the posterior oropharynx  Eyes: Conjunctivae and EOM are normal. No scleral icterus.  Neck: Normal range of motion.  Cardiovascular: Normal rate, regular rhythm and intact distal pulses.  Pulmonary/Chest: Effort normal. No stridor. No respiratory distress.  Respirations even and unlabored  Musculoskeletal: Normal range of motion.  Neurological: He is alert and oriented to person, place, and time. He exhibits normal muscle tone. Coordination normal.  Skin: Skin is warm and dry. No rash noted. He is not diaphoretic. No erythema. No pallor.  Psychiatric: He has a normal  mood and affect. His behavior is normal.  Nursing note and vitals reviewed.    ED Treatments / Results  Labs (all labs ordered are listed, but only abnormal results are displayed) Labs Reviewed - No data to display  EKG  EKG Interpretation None       Radiology Dg Chest 2 View  Result Date: 07/05/2017 CLINICAL DATA:  Ingested toothpick. EXAM: CHEST  2 VIEW COMPARISON:  None. FINDINGS: The heart size and mediastinal contours are within normal limits. Both lungs are clear. The visualized skeletal structures are unremarkable. IMPRESSION: No  radiopaque foreign body.  No active cardiopulmonary disease. Electronically Signed   By: Obie DredgeWilliam T Derry M.D.   On: 07/05/2017 14:45    Procedures Procedures (including critical care time)  Medications Ordered in ED Medications  0.9 %  sodium chloride infusion ( Intravenous MAR Unhold 07/05/17 2247)     Initial Impression / Assessment and Plan / ED Course  I have reviewed the triage vital signs and the nursing notes.  Pertinent labs & imaging results that were available during my care of the patient were reviewed by me and considered in my medical decision making (see chart for details).     8:45 PM Patient presenting after swallowing a toothpick earlier today.  He complains of foreign body sensation in his lower neck.  No inability to tolerate secretions.  He has not eaten or drank anything since 11 AM today.  Patient is very pleasant and in no acute distress.  He does not complain of shortness of breath.  X-ray shows no evidence of perforation.  Unable to see foreign body, though this is to be expected given organic material.  Case discussed with Dr. Rhea BeltonPyrtle of [facility] (the patient has been followed previously by Dr. Leone PayorGessner) .  Dr. Rhea BeltonPyrtle to mobilize endoscopy team for direct visualization.  Patient instructed to remain NPO.  10:24 PM Dr. Rhea BeltonPyrtle has discussed and consented patient for endoscopy procedure.  Patient remains in NAD.  10:39 PM Negative bedside endoscopy with no evidence of FB in the esophagus or stomach. Per Dr. Rhea BeltonPyrtle, patient no longer c/o FB sensation upon waking; however he remains heavily sedated.  11:21 PM Patient alert, answering questions appropriately.  VSS without hypoxia on room air.  No evidence of lethargy, slurred speech.  Given water.  Tolerating well.  No c/o dysphagia with oral intake.  Will ambulate.  11:44 PM Patient has remained stable.  No clinical decompensation.  Will continue with plan for d/c.  Return precautions discussed and provided.  Patient discharged in stable condition with no unaddressed concerns.   Final Clinical Impressions(s) / ED Diagnoses   Final diagnoses:  Foreign body sensation in throat    ED Discharge Orders    None       Antony MaduraHumes, Briseyda Fehr, PA-C 07/05/17 2345    Bethann BerkshireZammit, Joseph, MD 07/05/17 2356

## 2017-07-05 NOTE — ED Notes (Signed)
Pt tolerated po fluid.

## 2017-07-05 NOTE — ED Notes (Signed)
Provider walked patient out of ER

## 2017-07-05 NOTE — Op Note (Signed)
[facility] Patient Name: Reginald Fontaine CallasDavid Gill Procedure Date : 07/05/2017 MRN: [id number] Attending MD: Beverley FiedlerJay M Dravin Lance , MD Date of Birth: 29-Jul-1955 CSN: [id number] Age: [age] Admit Type: Outpatient Procedure:                Upper GI endoscopy Indications:              Foreign body in the esophagus, patient reports                            swallowing a toothpick earlier today Providers:                Carie CaddyJay M. Rhea BeltonPyrtle, MD, Norman ClayLisa Nunn, RN, Kandice RobinsonsGuillaume Awaka,                            Technician Referring MD:             Benny LennertJoseph L. Zammit, MD Medicines:                Midazolam 4 mg IV, Fentanyl 50 micrograms IV Complications:            No immediate complications. Estimated Blood Loss:     Estimated blood loss: none. Procedure:                Pre-Anesthesia Assessment:                           - Prior to the procedure, a History and Physical                            was performed, and patient medications and                            allergies were reviewed. The patient's tolerance of                            previous anesthesia was also reviewed. The risks                            and benefits of the procedure and the sedation                            options and risks were discussed with the patient.                            All questions were answered, and informed consent                            was obtained. Prior Anticoagulants: The patient has                            taken no previous anticoagulant or antiplatelet                            agents. ASA Grade Assessment: I - A normal, healthy  patient. After reviewing the risks and benefits,                            the patient was deemed in satisfactory condition to                            undergo the procedure.                           After obtaining informed consent, the endoscope was                            passed under direct vision. Throughout the            procedure, the patient's blood pressure, pulse, and                            oxygen saturations were monitored continuously. The                            [id number] ([id number]) scope was introduced through the                            mouth, and advanced to the second part of duodenum.                            The upper GI endoscopy was accomplished without                            difficulty. The patient tolerated the procedure                            well. Scope In: Scope Out: Findings:      The oropharynx was normal.      The larynx was normal.      The examined esophagus was normal.      There is no endoscopic evidence of foreign body in the entire esophagus.      Scattered mild inflammation characterized by erosions and erythema was       found in the gastric body and in the gastric antrum.      The examined duodenum was normal. Impression:               - Normal oropharynx.                           - Normal larynx.                           - Normal esophagus.                           - Mild gastritis.                           - Normal examined duodenum.                           -  No specimens collected. Moderate Sedation:      Moderate (conscious) sedation was administered by the endoscopy nurse       and supervised by the endoscopist. The following parameters were       monitored: oxygen saturation, heart rate, blood pressure, and response       to care. Total physician intraservice time was 13 minutes. Recommendation:           - Observe patient in emergency room for possible                            discharge same day.                           - Advance diet as tolerated.                           - Continue present medications.                           - Call [facility] immediately or return to the ER                            should you develop nausea, vomiting, hematemesis,                            abdominal pain, fevers or chills in the  next 7 days.                           - Patient has a contact number available for                            emergencies. The signs and symptoms of potential                            delayed complications were discussed with the                            patient. Return to normal activities tomorrow.                            Written discharge instructions were provided to the                            patient. Procedure Code(s):        --- Professional ---                           [phone], Esophagogastroduodenoscopy, flexible,                            transoral; diagnostic, including collection of                            specimen(s) by brushing or washing, when performed                            (  separate procedure)                           99152, Moderate sedation services provided by the                            same physician or other qualified health care                            professional performing the diagnostic or                            therapeutic service that the sedation supports,                            requiring the presence of an independent trained                            observer to assist in the monitoring of the                            patient's level of consciousness and physiological                            status; initial 15 minutes of intraservice time,                            patient age 84 years or older Diagnosis Code(s):        --- Professional ---                           K29.70, Gastritis, unspecified, without bleeding                           T18.108A, Unspecified foreign body in esophagus                            causing other injury, initial encounter CPT copyright 2016 [employer]. All rights reserved. The codes documented in this report are preliminary and upon coder review may  be revised to meet current compliance requirements. Beverley Fiedler, MD 07/05/2017 10:56:22 PM This report has been  signed electronically. Number of Addenda: 0

## 2017-07-06 DIAGNOSIS — M25562 Pain in left knee: Secondary | ICD-10-CM | POA: Diagnosis not present

## 2017-07-06 DIAGNOSIS — Z125 Encounter for screening for malignant neoplasm of prostate: Secondary | ICD-10-CM | POA: Diagnosis not present

## 2017-07-06 DIAGNOSIS — Z0000 Encounter for general adult medical examination without abnormal findings: Secondary | ICD-10-CM | POA: Diagnosis not present

## 2017-07-06 DIAGNOSIS — R946 Abnormal results of thyroid function studies: Secondary | ICD-10-CM | POA: Diagnosis not present

## 2017-07-09 ENCOUNTER — Encounter (HOSPITAL_COMMUNITY): Payer: Self-pay | Admitting: Internal Medicine

## 2017-07-13 DIAGNOSIS — E7849 Other hyperlipidemia: Secondary | ICD-10-CM | POA: Diagnosis not present

## 2017-07-13 DIAGNOSIS — C4491 Basal cell carcinoma of skin, unspecified: Secondary | ICD-10-CM | POA: Diagnosis not present

## 2017-07-13 DIAGNOSIS — R8290 Unspecified abnormal findings in urine: Secondary | ICD-10-CM | POA: Diagnosis not present

## 2017-07-13 DIAGNOSIS — Z1389 Encounter for screening for other disorder: Secondary | ICD-10-CM | POA: Diagnosis not present

## 2017-07-13 DIAGNOSIS — Z0000 Encounter for general adult medical examination without abnormal findings: Secondary | ICD-10-CM | POA: Diagnosis not present

## 2017-07-27 DIAGNOSIS — Z23 Encounter for immunization: Secondary | ICD-10-CM | POA: Diagnosis not present

## 2017-08-01 DIAGNOSIS — S83242A Other tear of medial meniscus, current injury, left knee, initial encounter: Secondary | ICD-10-CM | POA: Diagnosis not present

## 2017-08-01 DIAGNOSIS — M25532 Pain in left wrist: Secondary | ICD-10-CM | POA: Diagnosis not present

## 2017-11-22 DIAGNOSIS — M62838 Other muscle spasm: Secondary | ICD-10-CM | POA: Diagnosis not present

## 2017-11-22 DIAGNOSIS — M542 Cervicalgia: Secondary | ICD-10-CM | POA: Diagnosis not present

## 2017-11-29 DIAGNOSIS — M542 Cervicalgia: Secondary | ICD-10-CM | POA: Diagnosis not present

## 2017-11-29 DIAGNOSIS — M62838 Other muscle spasm: Secondary | ICD-10-CM | POA: Diagnosis not present

## 2017-12-18 DIAGNOSIS — M542 Cervicalgia: Secondary | ICD-10-CM | POA: Diagnosis not present

## 2017-12-18 DIAGNOSIS — M62838 Other muscle spasm: Secondary | ICD-10-CM | POA: Diagnosis not present

## 2018-01-01 DIAGNOSIS — M542 Cervicalgia: Secondary | ICD-10-CM | POA: Diagnosis not present

## 2018-01-01 DIAGNOSIS — M62838 Other muscle spasm: Secondary | ICD-10-CM | POA: Diagnosis not present

## 2018-03-07 DIAGNOSIS — H3589 Other specified retinal disorders: Secondary | ICD-10-CM | POA: Diagnosis not present

## 2018-03-07 DIAGNOSIS — H04123 Dry eye syndrome of bilateral lacrimal glands: Secondary | ICD-10-CM | POA: Diagnosis not present

## 2018-03-07 DIAGNOSIS — H4089 Other specified glaucoma: Secondary | ICD-10-CM | POA: Diagnosis not present

## 2018-03-16 DIAGNOSIS — Z23 Encounter for immunization: Secondary | ICD-10-CM | POA: Diagnosis not present

## 2018-03-29 DIAGNOSIS — M5416 Radiculopathy, lumbar region: Secondary | ICD-10-CM | POA: Diagnosis not present

## 2018-04-12 DIAGNOSIS — M5416 Radiculopathy, lumbar region: Secondary | ICD-10-CM | POA: Diagnosis not present

## 2018-04-25 DIAGNOSIS — M25512 Pain in left shoulder: Secondary | ICD-10-CM | POA: Diagnosis not present

## 2018-04-25 DIAGNOSIS — M25612 Stiffness of left shoulder, not elsewhere classified: Secondary | ICD-10-CM | POA: Diagnosis not present

## 2018-05-10 DIAGNOSIS — M25612 Stiffness of left shoulder, not elsewhere classified: Secondary | ICD-10-CM | POA: Diagnosis not present

## 2018-05-10 DIAGNOSIS — M25512 Pain in left shoulder: Secondary | ICD-10-CM | POA: Diagnosis not present

## 2018-06-04 DIAGNOSIS — D2362 Other benign neoplasm of skin of left upper limb, including shoulder: Secondary | ICD-10-CM | POA: Diagnosis not present

## 2018-06-04 DIAGNOSIS — L578 Other skin changes due to chronic exposure to nonionizing radiation: Secondary | ICD-10-CM | POA: Diagnosis not present

## 2018-06-04 DIAGNOSIS — Z85828 Personal history of other malignant neoplasm of skin: Secondary | ICD-10-CM | POA: Diagnosis not present

## 2018-06-04 DIAGNOSIS — L821 Other seborrheic keratosis: Secondary | ICD-10-CM | POA: Diagnosis not present

## 2018-06-06 DIAGNOSIS — M25612 Stiffness of left shoulder, not elsewhere classified: Secondary | ICD-10-CM | POA: Diagnosis not present

## 2018-06-06 DIAGNOSIS — M25512 Pain in left shoulder: Secondary | ICD-10-CM | POA: Diagnosis not present

## 2018-06-18 DIAGNOSIS — M25512 Pain in left shoulder: Secondary | ICD-10-CM | POA: Diagnosis not present

## 2018-10-09 DIAGNOSIS — Z125 Encounter for screening for malignant neoplasm of prostate: Secondary | ICD-10-CM | POA: Diagnosis not present

## 2018-10-09 DIAGNOSIS — E7849 Other hyperlipidemia: Secondary | ICD-10-CM | POA: Diagnosis not present

## 2018-10-09 DIAGNOSIS — R5381 Other malaise: Secondary | ICD-10-CM | POA: Diagnosis not present

## 2018-10-09 DIAGNOSIS — R946 Abnormal results of thyroid function studies: Secondary | ICD-10-CM | POA: Diagnosis not present

## 2018-10-10 DIAGNOSIS — R82998 Other abnormal findings in urine: Secondary | ICD-10-CM | POA: Diagnosis not present

## 2018-10-15 DIAGNOSIS — Z1331 Encounter for screening for depression: Secondary | ICD-10-CM | POA: Diagnosis not present

## 2018-10-15 DIAGNOSIS — H9209 Otalgia, unspecified ear: Secondary | ICD-10-CM | POA: Diagnosis not present

## 2018-10-15 DIAGNOSIS — C4491 Basal cell carcinoma of skin, unspecified: Secondary | ICD-10-CM | POA: Diagnosis not present

## 2018-10-15 DIAGNOSIS — Z0000 Encounter for general adult medical examination without abnormal findings: Secondary | ICD-10-CM | POA: Diagnosis not present

## 2018-10-15 DIAGNOSIS — R946 Abnormal results of thyroid function studies: Secondary | ICD-10-CM | POA: Diagnosis not present

## 2018-10-15 DIAGNOSIS — E785 Hyperlipidemia, unspecified: Secondary | ICD-10-CM | POA: Diagnosis not present

## 2019-01-03 DIAGNOSIS — M7502 Adhesive capsulitis of left shoulder: Secondary | ICD-10-CM | POA: Diagnosis not present

## 2019-01-03 DIAGNOSIS — M67912 Unspecified disorder of synovium and tendon, left shoulder: Secondary | ICD-10-CM | POA: Diagnosis not present

## 2019-01-14 DIAGNOSIS — M25512 Pain in left shoulder: Secondary | ICD-10-CM | POA: Diagnosis not present

## 2019-01-22 DIAGNOSIS — M7502 Adhesive capsulitis of left shoulder: Secondary | ICD-10-CM | POA: Diagnosis not present

## 2019-03-01 DIAGNOSIS — Z23 Encounter for immunization: Secondary | ICD-10-CM | POA: Diagnosis not present

## 2019-04-09 DIAGNOSIS — M4722 Other spondylosis with radiculopathy, cervical region: Secondary | ICD-10-CM | POA: Diagnosis not present

## 2019-04-09 DIAGNOSIS — M7502 Adhesive capsulitis of left shoulder: Secondary | ICD-10-CM | POA: Diagnosis not present

## 2019-04-15 DIAGNOSIS — M62830 Muscle spasm of back: Secondary | ICD-10-CM | POA: Diagnosis not present

## 2019-04-15 DIAGNOSIS — M5416 Radiculopathy, lumbar region: Secondary | ICD-10-CM | POA: Diagnosis not present

## 2019-06-30 IMAGING — CR DG CHEST 2V
2 series · 2 of 2 positions shown · non-contrast
Comparison: None.

CLINICAL DATA: Ingested toothpick.

EXAM:
CHEST  2 VIEW

[chest pa]
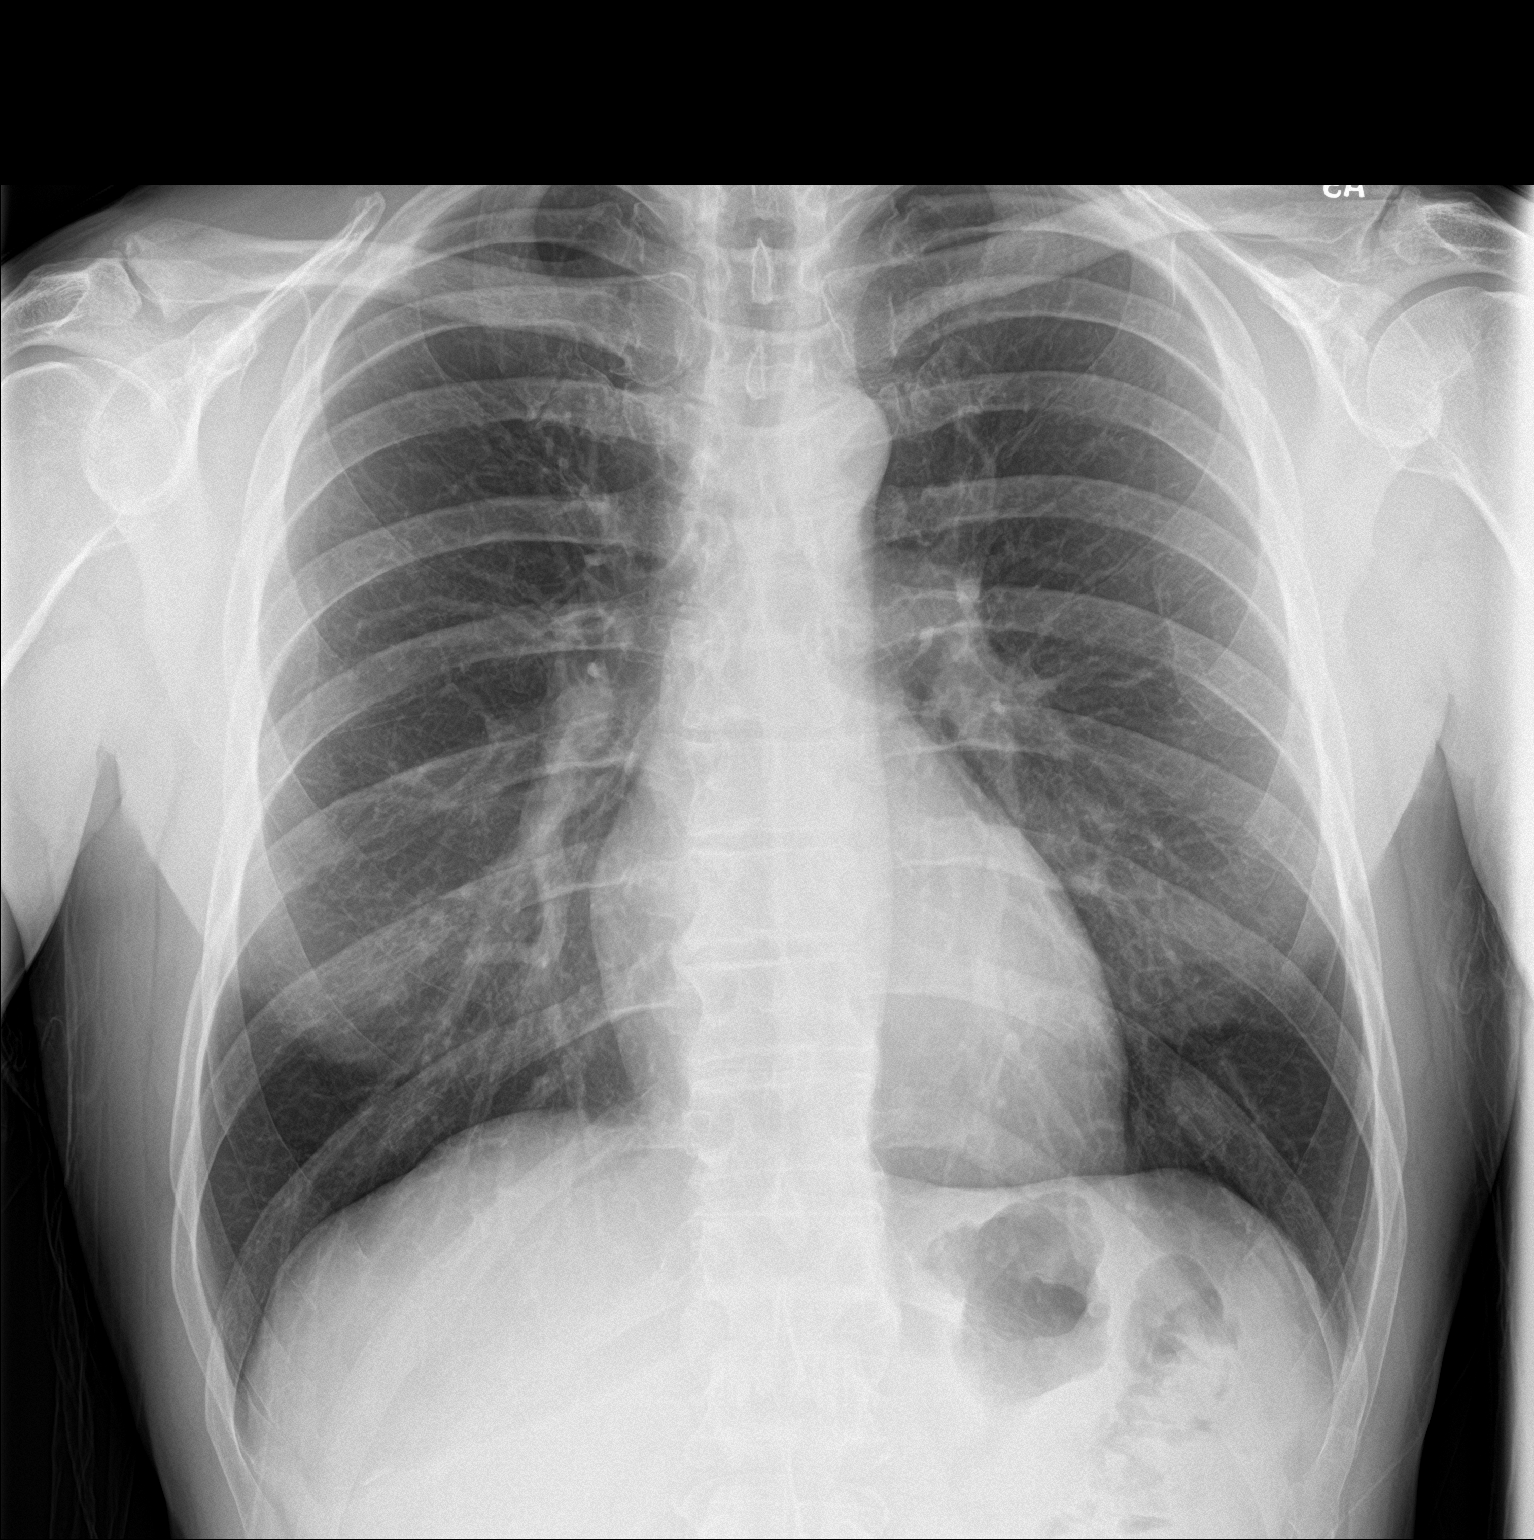

[chest lat]
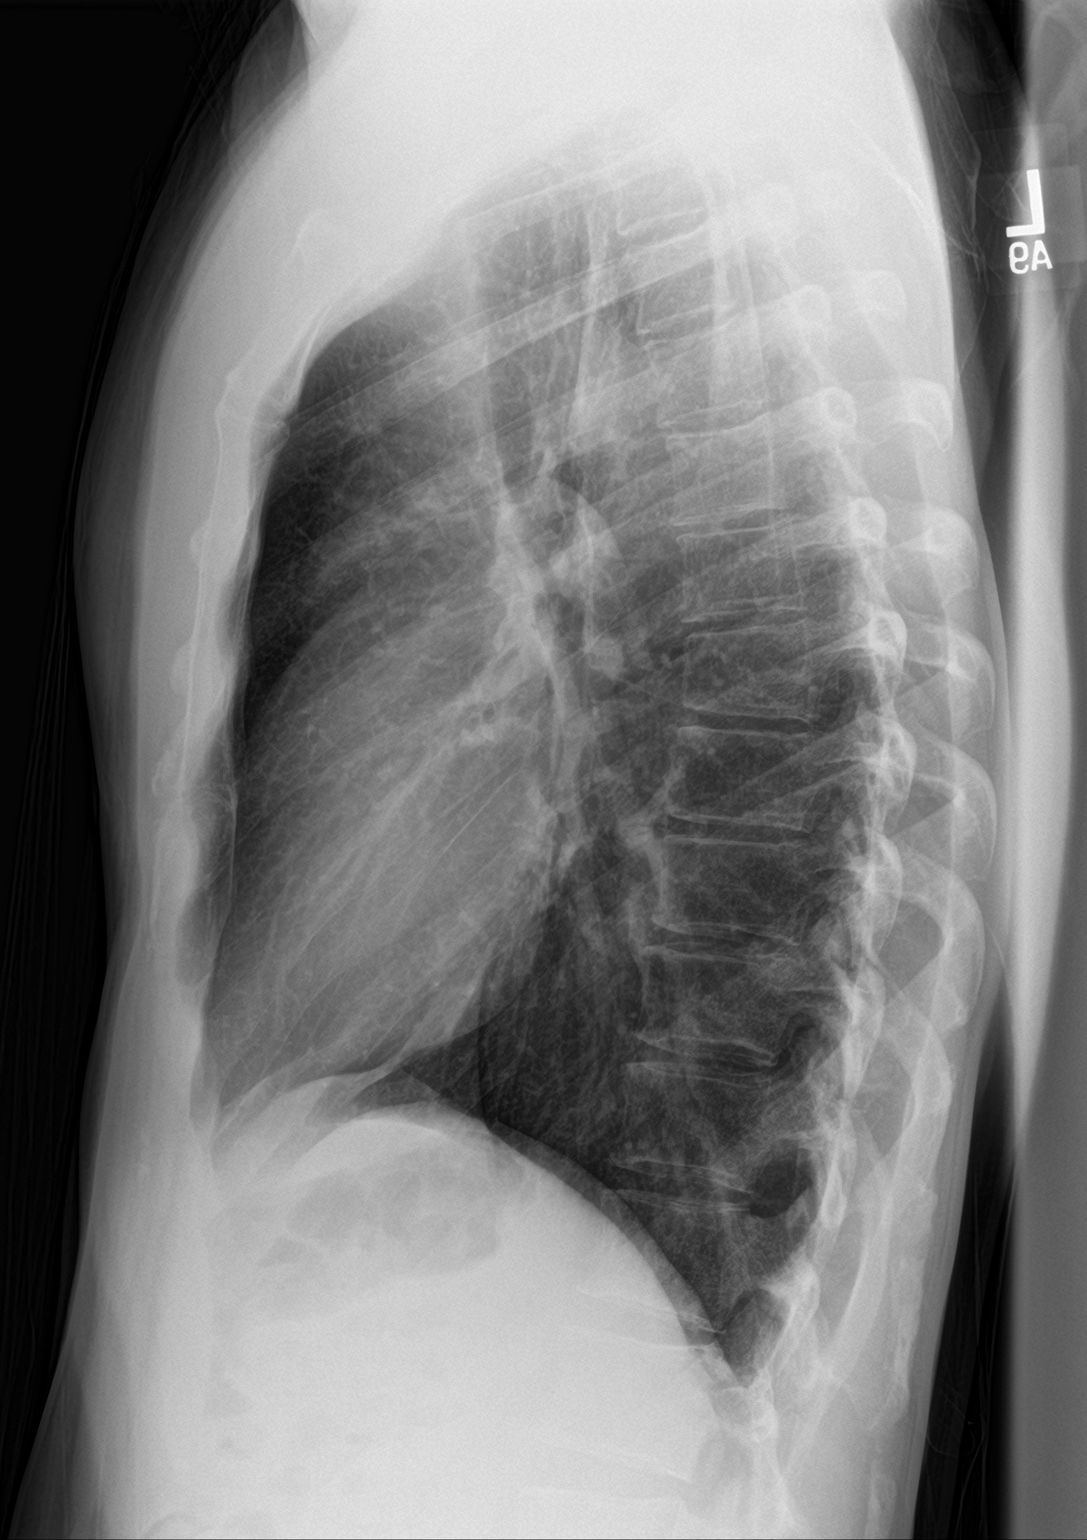

[2 of 2 positions shown; findings below may reference images not displayed]

FINDINGS: The heart size and mediastinal contours are within normal limits.
Both lungs are clear. The visualized skeletal structures are
unremarkable.
IMPRESSION: No radiopaque foreign body.  No active cardiopulmonary disease.

## 2020-11-19 ENCOUNTER — Emergency Department (HOSPITAL_COMMUNITY): Payer: 59

## 2020-11-19 ENCOUNTER — Other Ambulatory Visit: Payer: Self-pay

## 2020-11-19 ENCOUNTER — Inpatient Hospital Stay (HOSPITAL_COMMUNITY)
Admission: EM | Admit: 2020-11-19 | Discharge: 2020-11-20 | DRG: 604 | Disposition: A | Discharge status: Home / Self Care | Payer: 59 | Attending: Surgery | Admitting: Surgery

## 2020-11-19 ENCOUNTER — Encounter (HOSPITAL_COMMUNITY): Payer: Self-pay

## 2020-11-19 DIAGNOSIS — W3400XA Accidental discharge from unspecified firearms or gun, initial encounter: Secondary | ICD-10-CM

## 2020-11-19 DIAGNOSIS — S71131A Puncture wound without foreign body, right thigh, initial encounter: Principal | ICD-10-CM

## 2020-11-19 DIAGNOSIS — Z23 Encounter for immunization: Secondary | ICD-10-CM

## 2020-11-19 DIAGNOSIS — S31133A Puncture wound of abdominal wall without foreign body, right lower quadrant without penetration into peritoneal cavity, initial encounter: Principal | ICD-10-CM | POA: Diagnosis present

## 2020-11-19 DIAGNOSIS — Z79899 Other long term (current) drug therapy: Secondary | ICD-10-CM

## 2020-11-19 DIAGNOSIS — R578 Other shock: Secondary | ICD-10-CM | POA: Diagnosis present

## 2020-11-19 DIAGNOSIS — Y929 Unspecified place or not applicable: Secondary | ICD-10-CM

## 2020-11-19 DIAGNOSIS — Y22XXXA Handgun discharge, undetermined intent, initial encounter: Secondary | ICD-10-CM | POA: Diagnosis present

## 2020-11-19 DIAGNOSIS — Z20822 Contact with and (suspected) exposure to covid-19: Secondary | ICD-10-CM | POA: Diagnosis present

## 2020-11-19 LAB — BASIC METABOLIC PANEL
Anion gap: 8 (ref 5–15)
BUN: 24 mg/dL — ABNORMAL HIGH (ref 8–23)
CO2: 24 mmol/L (ref 22–32)
Calcium: 8.6 mg/dL — ABNORMAL LOW (ref 8.9–10.3)
Chloride: 104 mmol/L (ref 98–111)
Creatinine, Ser: 0.84 mg/dL (ref 0.61–1.24)
GFR, Estimated: >60 mL/min (ref >60)
Glucose, Bld: 121 mg/dL — ABNORMAL HIGH (ref 70–99)
Potassium: 3.9 mmol/L (ref 3.5–5.1)
Sodium: 136 mmol/L (ref 135–145)

## 2020-11-19 LAB — CBC
HCT: 40.9 % (ref 39.0–52.0)
Hemoglobin: 13.8 g/dL (ref 13.0–17.0)
MCH: 31.9 pg (ref 26.0–34.0)
MCHC: 33.7 g/dL (ref 30.0–36.0)
MCV: 94.7 fL (ref 80.0–100.0)
Platelets: 168 10*3/uL (ref 150–400)
RBC: 4.32 MIL/uL (ref 4.22–5.81)
RDW: 11.9 % (ref 11.5–15.5)
WBC: 6.6 10*3/uL (ref 4.0–10.5)
nRBC: 0 % (ref 0.0–0.2)

## 2020-11-19 LAB — I-STAT CHEM 8, ED
BUN: 26 mg/dL — ABNORMAL HIGH (ref 8–23)
Calcium, Ion: 1.06 mmol/L — ABNORMAL LOW (ref 1.15–1.40)
Chloride: 103 mmol/L (ref 98–111)
Creatinine, Ser: 0.8 mg/dL (ref 0.61–1.24)
Glucose, Bld: 119 mg/dL — ABNORMAL HIGH (ref 70–99)
HCT: 40 % (ref 39.0–52.0)
Hemoglobin: 13.6 g/dL (ref 13.0–17.0)
Potassium: 3.9 mmol/L (ref 3.5–5.1)
Sodium: 138 mmol/L (ref 135–145)
TCO2: 23 mmol/L (ref 22–32)

## 2020-11-19 MED ORDER — TETANUS-DIPHTH-ACELL PERTUSSIS 5-2.5-18.5 LF-MCG/0.5 IM SUSY
0.5000 mL | PREFILLED_SYRINGE | Freq: Once | INTRAMUSCULAR | Status: AC
  Administered 2020-11-19: 0.5 mL via INTRAMUSCULAR

## 2020-11-19 MED ORDER — ACETAMINOPHEN 325 MG PO TABS
650.0000 mg | ORAL_TABLET | Freq: Once | ORAL | Status: AC
  Administered 2020-11-19: 650 mg via ORAL
  Filled 2020-11-19: qty 2

## 2020-11-19 MED ORDER — IOHEXOL 350 MG/ML SOLN
100.0000 mL | Freq: Once | INTRAVENOUS | Status: AC | PRN
  Administered 2020-11-19: 100 mL via INTRAVENOUS
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
## 2020-11-19 NOTE — ED Notes (Signed)
Patient transported to CT 
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
## 2020-11-20 DIAGNOSIS — W3400XA Accidental discharge from unspecified firearms or gun, initial encounter: Secondary | ICD-10-CM

## 2020-11-20 DIAGNOSIS — Y929 Unspecified place or not applicable: Secondary | ICD-10-CM | POA: Diagnosis not present

## 2020-11-20 DIAGNOSIS — Y22XXXA Handgun discharge, undetermined intent, initial encounter: Secondary | ICD-10-CM | POA: Diagnosis present

## 2020-11-20 DIAGNOSIS — Z23 Encounter for immunization: Secondary | ICD-10-CM | POA: Diagnosis not present

## 2020-11-20 DIAGNOSIS — Z20822 Contact with and (suspected) exposure to covid-19: Secondary | ICD-10-CM | POA: Diagnosis present

## 2020-11-20 DIAGNOSIS — R578 Other shock: Secondary | ICD-10-CM | POA: Diagnosis present

## 2020-11-20 DIAGNOSIS — S31133A Puncture wound of abdominal wall without foreign body, right lower quadrant without penetration into peritoneal cavity, initial encounter: Secondary | ICD-10-CM | POA: Diagnosis present

## 2020-11-20 DIAGNOSIS — Z79899 Other long term (current) drug therapy: Secondary | ICD-10-CM | POA: Diagnosis not present

## 2020-11-20 LAB — BASIC METABOLIC PANEL
Anion gap: 5 (ref 5–15)
BUN: 20 mg/dL (ref 8–23)
CO2: 24 mmol/L (ref 22–32)
Calcium: 8.1 mg/dL — ABNORMAL LOW (ref 8.9–10.3)
Chloride: 104 mmol/L (ref 98–111)
Creatinine, Ser: 0.75 mg/dL (ref 0.61–1.24)
GFR, Estimated: >60 mL/min (ref >60)
Glucose, Bld: 128 mg/dL — ABNORMAL HIGH (ref 70–99)
Potassium: 4.2 mmol/L (ref 3.5–5.1)
Sodium: 133 mmol/L — ABNORMAL LOW (ref 135–145)

## 2020-11-20 LAB — RESP PANEL BY RT-PCR (FLU A&B, COVID) ARPGX2
Influenza A by PCR: NEGATIVE
Influenza B by PCR: NEGATIVE
SARS Coronavirus 2 by RT PCR: NEGATIVE

## 2020-11-20 LAB — CBC
HCT: 33.6 % — ABNORMAL LOW (ref 39.0–52.0)
Hemoglobin: 11.9 g/dL — ABNORMAL LOW (ref 13.0–17.0)
MCH: 32.1 pg (ref 26.0–34.0)
MCHC: 35.4 g/dL (ref 30.0–36.0)
MCV: 90.6 fL (ref 80.0–100.0)
Platelets: 151 10*3/uL (ref 150–400)
RBC: 3.71 MIL/uL — ABNORMAL LOW (ref 4.22–5.81)
RDW: 12.3 % (ref 11.5–15.5)
WBC: 9.2 10*3/uL (ref 4.0–10.5)
nRBC: 0 % (ref 0.0–0.2)

## 2020-11-20 LAB — PREPARE RBC (CROSSMATCH)

## 2020-11-20 LAB — PROTIME-INR
INR: 1.2 (ref 0.8–1.2)
Prothrombin Time: 15.1 seconds (ref 11.4–15.2)

## 2020-11-20 LAB — MRSA PCR SCREENING: MRSA by PCR: NEGATIVE

## 2020-11-20 LAB — BLOOD PRODUCT ORDER (VERBAL) VERIFICATION

## 2020-11-20 LAB — HIV ANTIBODY (ROUTINE TESTING W REFLEX): HIV Screen 4th Generation wRfx: NONREACTIVE

## 2020-11-20 LAB — ABO/RH: ABO/RH(D): A NEG

## 2020-11-20 MED ORDER — MORPHINE SULFATE (PF) 4 MG/ML IV SOLN
4.0000 mg | Freq: Once | INTRAVENOUS | Status: AC
  Administered 2020-11-20: 4 mg via INTRAVENOUS
  Filled 2020-11-20: qty 1

## 2020-11-20 MED ORDER — DOCUSATE SODIUM 100 MG PO CAPS: 100.0000 mg | ORAL_CAPSULE | Freq: Two times a day (BID) | ORAL | 2 refills | Status: AC

## 2020-11-20 MED ORDER — SODIUM CHLORIDE 0.9 % IV SOLN
10.0000 mL/h | Freq: Once | INTRAVENOUS | Status: AC
  Administered 2020-11-20: 10 mL/h via INTRAVENOUS

## 2020-11-20 MED ORDER — ACETAMINOPHEN 500 MG PO TABS
1000.0000 mg | ORAL_TABLET | Freq: Four times a day (QID) | ORAL | Status: DC
  Administered 2020-11-20: 1000 mg via ORAL
  Filled 2020-11-20: qty 2

## 2020-11-20 MED ORDER — ONDANSETRON HCL 4 MG/2ML IJ SOLN
4.0000 mg | Freq: Four times a day (QID) | INTRAMUSCULAR | Status: DC | PRN
Start: 2020-11-20 — End: 2020-11-20

## 2020-11-20 MED ORDER — OXYCODONE HCL 5 MG PO TABS
5.0000 mg | ORAL_TABLET | ORAL | Status: DC | PRN
Start: 2020-11-20 — End: 2020-11-20
  Administered 2020-11-20: 5 mg via ORAL
  Filled 2020-11-20: qty 1

## 2020-11-20 MED ORDER — METHOCARBAMOL 500 MG PO TABS
1000.0000 mg | ORAL_TABLET | Freq: Three times a day (TID) | ORAL | Status: DC
  Filled 2020-11-20: qty 2

## 2020-11-20 MED ORDER — ACETAMINOPHEN 325 MG PO TABS: 650.0000 mg | ORAL_TABLET | ORAL | Status: DC | PRN

## 2020-11-20 MED ORDER — OXYCODONE HCL 5 MG PO TABS: 10.0000 mg | ORAL_TABLET | ORAL | Status: DC | PRN

## 2020-11-20 MED ORDER — MORPHINE SULFATE (PF) 4 MG/ML IV SOLN: 4.0000 mg | INTRAVENOUS | Status: DC | PRN

## 2020-11-20 MED ORDER — CHLORHEXIDINE GLUCONATE CLOTH 2 % EX PADS
6.0000 | MEDICATED_PAD | Freq: Every day | CUTANEOUS | Status: DC
  Administered 2020-11-20: 6 via TOPICAL

## 2020-11-20 MED ORDER — ONDANSETRON 4 MG PO TBDP
4.0000 mg | ORAL_TABLET | Freq: Four times a day (QID) | ORAL | Status: DC | PRN
Start: 2020-11-20 — End: 2020-11-20

## 2020-11-20 MED ORDER — METHOCARBAMOL 750 MG PO TABS: 750.0000 mg | ORAL_TABLET | Freq: Four times a day (QID) | ORAL | 1 refills | Status: DC

## 2020-11-20 MED ORDER — POTASSIUM CHLORIDE IN NACL 20-0.9 MEQ/L-% IV SOLN
INTRAVENOUS | Status: DC
  Filled 2020-11-20: qty 1000

## 2020-11-20 MED ORDER — LACTATED RINGERS IV BOLUS
1000.0000 mL | Freq: Once | INTRAVENOUS | Status: AC
  Administered 2020-11-20: 1000 mL via INTRAVENOUS

## 2020-11-20 MED ORDER — IBUPROFEN 600 MG PO TABS: 600.0000 mg | ORAL_TABLET | Freq: Four times a day (QID) | ORAL | 1 refills | Status: DC

## 2020-11-20 MED ORDER — OXYCODONE HCL 5 MG PO TABS: 5.0000 mg | ORAL_TABLET | ORAL | 0 refills | Status: DC | PRN

## 2020-11-20 NOTE — Plan of Care (Signed)
Patient plans to have a friend pick him up at discharge and will transport himself to follow up appointments.

## 2020-11-20 NOTE — Discharge Instructions (Signed)
May shower beginning 11/21/2020. May allow warm soapy water to run over incision, then rinse and pat dry. Do not soak in any water (tubs, hot tubs, pools, lakes, oceans) for one week.   Pain regimen: take over-the-counter tylenol (acetaminophen) 1000mg  every six hours, the prescription ibuprofen (600mg ) every six hours and the robaxin (methocarbamol) 750mg  every six hours. With all three of these, you should be taking something every two hours. Example: tylenol ( acetaminophen) at 8am, ibuprofen at 10am, robaxin (methocarbamol) at 12pm, tylenol (acetaminophen) again at 2pm, ibuprofen again at 4pm, robaxin (methocarbamol) at 6pm. You also have a prescription for oxycodone, which should be taken if the tylenol (acetaminophen), ibuprofen, and robaxin (methocarbamol) are not enough to control your pain. You may take the oxycodone as frequently as every four hours as needed, but if you are taking the other medications as above, you should not need the oxycodone this frequently. You have also been given a prescription for colace (docusate) which is a stool softener. Please take this as prescribed because the oxycodone can cause constipation and the colace (docusate) will minimize or prevent constipation.  Call the office at [phone] for temperature greater than 101.72F, worsening pain, redness or warmth of any of the wounds.
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
## 2020-11-20 NOTE — Progress Notes (Signed)
Patient received 1 unit of packed red blood cells. Blood Bag [id number] did not arrive on the unit with green sheet attached. Blood ended at 0320.
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
## 2020-11-21 LAB — TYPE AND SCREEN
ABO/RH(D): A NEG
Antibody Screen: NEGATIVE
Unit division: 0

## 2020-11-21 LAB — BPAM RBC
Blood Product Expiration Date: 202206282359
ISSUE DATE / TIME: 202206040058
Unit Type and Rh: 5100

## 2020-11-22 ENCOUNTER — Encounter (HOSPITAL_COMMUNITY): Payer: Self-pay | Admitting: Internal Medicine
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
## 2021-12-21 ENCOUNTER — Other Ambulatory Visit: Payer: Self-pay | Admitting: Internal Medicine

## 2021-12-21 DIAGNOSIS — E785 Hyperlipidemia, unspecified: Secondary | ICD-10-CM

## 2021-12-26 ENCOUNTER — Ambulatory Visit
Admission: RE | Admit: 2021-12-26 | Discharge: 2021-12-26 | Disposition: A | Discharge status: Home / Self Care | Payer: Medicare Other | Source: Ambulatory Visit | Attending: Internal Medicine | Admitting: Internal Medicine

## 2021-12-26 DIAGNOSIS — E785 Hyperlipidemia, unspecified: Secondary | ICD-10-CM

## 2022-05-25 ENCOUNTER — Encounter: Payer: Self-pay | Admitting: Nurse Practitioner

## 2022-06-29 ENCOUNTER — Ambulatory Visit (INDEPENDENT_AMBULATORY_CARE_PROVIDER_SITE_OTHER): Payer: Medicare Other | Admitting: Nurse Practitioner

## 2022-06-29 ENCOUNTER — Encounter: Payer: Self-pay | Admitting: Nurse Practitioner

## 2022-06-29 VITALS — BP 118/62 | HR 61 | Ht 71.0 in | Wt 168.0 lb

## 2022-06-29 DIAGNOSIS — K644 Residual hemorrhoidal skin tags: Secondary | ICD-10-CM | POA: Diagnosis not present

## 2022-06-29 DIAGNOSIS — K648 Other hemorrhoids: Secondary | ICD-10-CM

## 2022-06-29 MED ORDER — NA SULFATE-K SULFATE-MG SULF 17.5-3.13-1.6 GM/177ML PO SOLN: 1.0000 | Freq: Once | ORAL | 0 refills | Status: AC

## 2022-06-29 NOTE — Patient Instructions (Signed)
You have been scheduled for a colonoscopy. Please follow written instructions given to you at your visit today.  Please pick up your prep supplies at the pharmacy within the next 1-3 days. If you use inhalers (even only as needed), please bring them with you on the day of your procedure.   We have sent the following medications to your pharmacy for you to pick up at your convenience: Suprep  Due to recent changes in healthcare laws, you may see the results of your imaging and laboratory studies on MyChart before your provider has had a chance to review them.  We understand that in some cases there may be results that are confusing or concerning to you. Not all laboratory results come back in the same time frame and the provider may be waiting for multiple results in order to interpret others.  Please give us 48 hours in order for your provider to thoroughly review all the results before contacting the office for clarification of your results.    Thank you for trusting me with your gastrointestinal care!   Colleen Kennedy-Smith, CRNP   
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
## 2022-08-29 ENCOUNTER — Encounter: Payer: Self-pay | Admitting: Certified Registered Nurse Anesthetist

## 2022-09-01 ENCOUNTER — Encounter: Payer: Self-pay | Admitting: Internal Medicine

## 2022-09-01 ENCOUNTER — Ambulatory Visit (AMBULATORY_SURGERY_CENTER): Payer: Medicare Other | Admitting: Internal Medicine

## 2022-09-01 VITALS — BP 114/63 | HR 52 | Temp 98.4°F | Resp 12 | Ht 71.0 in | Wt 168.0 lb

## 2022-09-01 DIAGNOSIS — Z1211 Encounter for screening for malignant neoplasm of colon: Secondary | ICD-10-CM

## 2022-09-01 MED ORDER — SODIUM CHLORIDE 0.9 % IV SOLN: 500.0000 mL | INTRAVENOUS | Status: DC

## 2022-09-01 NOTE — Progress Notes (Signed)
[facility] History and Physical   Primary Care Physician:  Crist Infante, MD   Reason for Procedure:   CRCA screening  Plan:    colonoscopy     HPI: Reginald Gill is a 67 y.o. male with recent hemorrhoid complaints and seen in jan 2024- due for routine repeat colonoscopy this year - priocedure was scheduled.   Past Medical History:  Diagnosis Date   Hyperlipidemia    Internal and external bleeding hemorrhoids 02/28/2017    Past Surgical History:  Procedure Laterality Date   COLONOSCOPY  08/30/2007   Dr. Silvano Rusk   ESOPHAGOGASTRODUODENOSCOPY N/A 07/05/2017   Procedure: ESOPHAGOGASTRODUODENOSCOPY (EGD);  Surgeon: Jerene Bears, MD;  Location: [facility] ENDOSCOPY;  Service: Gastroenterology;  Laterality: N/A;    Prior to Admission medications   Medication Sig Start Date End Date Taking? Authorizing Provider  Ascorbic Acid (VITAMIN C) 1000 MG tablet Take 1,000 mg by mouth every other day.   Yes [provider]  Cholecalciferol (VITAMIN D-3) 125 MCG (5000 UT) TABS Take 5,000 Units by mouth daily.   Yes [provider]  GLUCOSAMINE-CHONDROITIN PO Take 1 capsule by mouth daily.   Yes [provider]  Multiple Vitamin (MULTIVITAMIN) tablet Take 1 tablet by mouth daily.   Yes [provider]  Multiple Vitamins-Minerals (LUTEIN-ZEAXANTHIN PO) Take 1 tablet by mouth daily.   Yes [provider]  Carboxymeth-Glycerin-Polysorb (REFRESH DIGITAL OP) Place 1 drop into both eyes in the morning and at bedtime.    [provider]  hydrocortisone (ANUSOL-HC) 2.5 % rectal cream Place 1 application rectally 2 (two) times daily as needed for hemorrhoids. Patient not taking: Reported on 07/05/2017 02/28/17   Gatha Mayer, MD    Current Outpatient Medications  Medication Sig Dispense Refill   Ascorbic Acid (VITAMIN C) 1000 MG tablet Take 1,000 mg by mouth every other day.     Cholecalciferol (VITAMIN D-3) 125 MCG (5000 UT) TABS  Take 5,000 Units by mouth daily.     GLUCOSAMINE-CHONDROITIN PO Take 1 capsule by mouth daily.     Multiple Vitamin (MULTIVITAMIN) tablet Take 1 tablet by mouth daily.     Multiple Vitamins-Minerals (LUTEIN-ZEAXANTHIN PO) Take 1 tablet by mouth daily.     Carboxymeth-Glycerin-Polysorb (REFRESH DIGITAL OP) Place 1 drop into both eyes in the morning and at bedtime.     hydrocortisone (ANUSOL-HC) 2.5 % rectal cream Place 1 application rectally 2 (two) times daily as needed for hemorrhoids. (Patient not taking: Reported on 07/05/2017) 30 g 1   Current Facility-Administered Medications  Medication Dose Route Frequency Provider Last Rate Last Admin   0.9 %  sodium chloride infusion  500 mL Intravenous Continuous Gatha Mayer, MD        Allergies as of 09/01/2022   (No Known Allergies)    Family History  Problem Relation Age of Onset   Ovarian cancer Mother    Colon cancer Neg Hx    Stomach cancer Neg Hx    Esophageal cancer Neg Hx    Rectal cancer Neg Hx     Social History   Socioeconomic History   Marital status: Married    Spouse name: Not on file   Number of children: 0   Years of education: Not on file   Highest education level: Not on file  Occupational History   Occupation: dentist  Tobacco Use   Smoking status: Never   Smokeless tobacco: Never  Vaping Use   Vaping Use: Never used  Substance and Sexual  Activity   Alcohol use: Yes    Comment: occasionally   Drug use: No   Sexual activity: Not on file  Other Topics Concern   Not on file  Social History Narrative   ** Merged History Encounter **       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 02/28/2017    Social Determinants of Health   Financial Resource Strain: Not on file  Food Insecurity: Not on file  Transportation Needs: Not on file  Physical Activity: Not on file  Stress: Not on file  Social Connections: Not on file  Intimate Partner Violence: Not on file    Review of  Systems:  All other review of systems negative except as mentioned in the HPI.  Physical Exam: Vital signs BP (!) 103/58   Pulse (!) 55   Temp 98.4 F (36.9 C)   Ht 5\' 11"  (1.803 m)   Wt 168 lb (76.2 kg)   SpO2 97%   BMI 23.43 kg/m   General:   Alert,  Well-developed, well-nourished, pleasant and cooperative in NAD Lungs:  Clear throughout to auscultation.   Heart:  Regular rate and rhythm; no murmurs, clicks, rubs,  or gallops. Abdomen:  Soft, nontender and nondistended. Normal bowel sounds.   Neuro/Psych:  Alert and cooperative. Normal mood and affect. A and O x 3   @[place]  Simonne Maffucci, MD, [facility] [phone] (pager) 09/01/2022 8:55 AM@

## 2022-09-01 NOTE — Patient Instructions (Addendum)
No polyps or cancer were seen. Hemorrhoids did not look swollen or inflamed out of the ordinary after colonoscopy prep.  You may consider repeating a colonoscopy in 10 years but I and many experts recommend stopping after 75 - but you will be close so see what you think then. Signs and symptoms are different and may be investigated at any age.  I appreciate the opportunity to care for you. Gatha Mayer, MD, [facility]   Handout on hemorrhoids provided.    YOU HAD AN ENDOSCOPIC PROCEDURE TODAY AT [facility]:   Refer to the procedure report that was given to you for any specific questions about what was found during the examination.  If the procedure report does not answer your questions, please call your gastroenterologist to clarify.  If you requested that your care partner not be given the details of your procedure findings, then the procedure report has been included in a sealed envelope for you to review at your convenience later.  YOU SHOULD EXPECT: Some feelings of bloating in the abdomen. Passage of more gas than usual.  Walking can help get rid of the air that was put into your GI tract during the procedure and reduce the bloating. If you had a lower endoscopy (such as a colonoscopy or flexible sigmoidoscopy) you may notice spotting of blood in your stool or on the toilet paper. If you underwent a bowel prep for your procedure, you may not have a normal bowel movement for a few days.  Please Note:  You might notice some irritation and congestion in your nose or some drainage.  This is from the oxygen used during your procedure.  There is no need for concern and it should clear up in a day or so.  SYMPTOMS TO REPORT IMMEDIATELY:  Following lower endoscopy (colonoscopy or flexible sigmoidoscopy):  Excessive amounts of blood in the stool  Significant tenderness or worsening of abdominal pains  Swelling of the abdomen that is new, acute  Fever of 100F or higher   For  urgent or emergent issues, a gastroenterologist can be reached at any hour by calling [phone]. Do not use MyChart messaging for urgent concerns.    DIET:  We do recommend a small meal at first, but then you may proceed to your regular diet.  Drink plenty of fluids but you should avoid alcoholic beverages for 24 hours.  ACTIVITY:  You should plan to take it easy for the rest of today and you should NOT DRIVE or use heavy machinery until tomorrow (because of the sedation medicines used during the test).    FOLLOW UP: Our staff will call the number listed on your records the next business day following your procedure.  We will call around 7:15- 8:00 am to check on you and address any questions or concerns that you may have regarding the information given to you following your procedure. If we do not reach you, we will leave a message.     If any biopsies were taken you will be contacted by phone or by letter within the next 1-3 weeks.  Please call us at [phone] if you have not heard about the biopsies in 3 weeks.    SIGNATURES/CONFIDENTIALITY: You and/or your care partner have signed paperwork which will be entered into your electronic medical record.  These signatures attest to the fact that that the information above on your After Visit Summary has been reviewed and is understood.  Full responsibility of  the confidentiality of this discharge information lies with you and/or your care-partner.

## 2022-09-01 NOTE — Progress Notes (Signed)
Patient reports no health changes or medications changes since office visit.

## 2022-09-01 NOTE — Progress Notes (Signed)
Report given to PACU, vss 

## 2022-09-01 NOTE — Op Note (Signed)
[place] Patient Name: Reginald Gill Procedure Date: 09/01/2022 8:51 AM MRN: [id number] Endoscopist: Gatha Mayer , MD, [id number] Age: 67 Referring MD:  Date of Birth: Oct 19, 1955 Gender: Male Account #: [id number] Procedure:                Colonoscopy Indications:              Screening for colorectal malignant neoplasm, Last                            colonoscopy: 2014 Medicines:                Monitored Anesthesia Care Procedure:                Pre-Anesthesia Assessment:                           - Prior to the procedure, a History and Physical                            was performed, and patient medications and                            allergies were reviewed. The patient's tolerance of                            previous anesthesia was also reviewed. The risks                            and benefits of the procedure and the sedation                            options and risks were discussed with the patient.                            All questions were answered, and informed consent                            was obtained. Prior Anticoagulants: The patient has                            taken no anticoagulant or antiplatelet agents. ASA                            Grade Assessment: I - A normal, healthy patient.                            After reviewing the risks and benefits, the patient                            was deemed in satisfactory condition to undergo the                            procedure.  After obtaining informed consent, the colonoscope                            was passed under direct vision. Throughout the                            procedure, the patient's blood pressure, pulse, and                            oxygen saturations were monitored continuously. The                            Olympus CF-HQ190L [id number]) Colonoscope was                            introduced through the anus and advanced to the the                             cecum, identified by appendiceal orifice and                            ileocecal valve. The colonoscopy was performed                            without difficulty. The patient tolerated the                            procedure well. The quality of the bowel                            preparation was good. The ileocecal valve,                            appendiceal orifice, and rectum were photographed.                            The bowel preparation used was SUPREP via split                            dose instruction. Scope In: 9:05:27 AM Scope Out: 9:19:08 AM Scope Withdrawal Time: 0 hours 9 minutes 1 second  Total Procedure Duration: 0 hours 13 minutes 41 seconds  Findings:                 The perianal and digital rectal examinations were                            normal. Pertinent negatives include normal prostate                            (size, shape, and consistency).                           External and internal hemorrhoids were found. The  hemorrhoids were small.                           The exam was otherwise without abnormality on                            direct and retroflexion views. Complications:            No immediate complications. Estimated Blood Loss:     Estimated blood loss: none. Impression:               - External and internal hemorrhoids.                           - The examination was otherwise normal on direct                            and retroflexion views.                           - No specimens collected. Recommendation:           - Patient has a contact number available for                            emergencies. The signs and symptoms of potential                            delayed complications were discussed with the                            patient. Return to normal activities tomorrow.                            Written discharge instructions were provided to the                             patient.                           - No repeat colonoscopy due to current age (19                            years or older) and the absence of colonic polyps.                            he may consider a repeat at 36 if desired but will                            not place on recall list. Gatha Mayer, MD 09/01/2022 9:29:12 AM This report has been signed electronically.

## 2022-09-04 ENCOUNTER — Telehealth: Payer: Self-pay | Admitting: *Deleted

## 2022-09-04 NOTE — Telephone Encounter (Signed)
  Follow up Call-     09/01/2022    8:26 AM  Call back number  Post procedure Call Back phone  # [phone]  Permission to leave phone message Yes     Patient questions:  Do you have a fever, pain , or abdominal swelling? No. Pain Score  0 *  Have you tolerated food without any problems? Yes.    Have you been able to return to your normal activities? Yes.    Do you have any questions about your discharge instructions: Diet   No. Medications  No. Follow up visit  No.  Do you have questions or concerns about your Care? No.  Actions: * If pain score is 4 or above: No action needed, pain <4.
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
## 2023-04-17 NOTE — Progress Notes (Unsigned)
Cardiology Office Note:    Date:  04/18/2023   ID:  Reginald Gill, DOB [birth year], MRN [id number]  PCP:  Rodrigo Ran, MD  Cardiologist:  None  Electrophysiologist:  None   Referring MD: Rodrigo Ran, MD   Chief Complaint  Patient presents with   Coronary Artery Disease    History of Present Illness:    Reginald Gill is a 67 y.o. male with a hx of hyperlipidemia who is referred by Dr. Waynard Edwards for evaluation of CAD.  Underwent calcium score 12/2021, which was 497 (82nd percentile).  Reports he did not tolerate atorvastatin due to transaminitis and did not tolerate rosuvastatin due to myalgias.  He was started on Repatha, which he has been tolerating.  Denies any chest pain, dyspnea, lightheadedness, syncope, lower extremity edema, or palpitations.  Rides bike 4-5 times per week for 9 miles and also does jujitsu.  Never  smoked.  Family history includes sister had CABG in late 81s.     Past Medical History:  Diagnosis Date   Hyperlipidemia    Internal and external bleeding hemorrhoids 02/28/2017    Past Surgical History:  Procedure Laterality Date   COLONOSCOPY  08/30/2007   Dr. Stan Head   ESOPHAGOGASTRODUODENOSCOPY N/A 07/05/2017   Procedure: ESOPHAGOGASTRODUODENOSCOPY (EGD);  Surgeon: Beverley Fiedler, MD;  Location: [facility] ENDOSCOPY;  Service: Gastroenterology;  Laterality: N/A;    Current Medications: Current Meds  Medication Sig   Ascorbic Acid (VITAMIN C) 1000 MG tablet Take 1,000 mg by mouth every other day.   Cholecalciferol (VITAMIN D-3) 125 MCG (5000 UT) TABS Take 5,000 Units by mouth daily.   GLUCOSAMINE-CHONDROITIN PO Take 1 capsule by mouth daily.   Magnesium 250 MG TABS Take 250 mg by mouth daily.   Multiple Vitamins-Minerals (LUTEIN-ZEAXANTHIN PO) Take 1 tablet by mouth daily.   REPATHA SURECLICK 140 MG/ML SOAJ Inject 140 mg into the skin every 14 (fourteen) days.   UNABLE TO FIND Take 90-100 mg by mouth daily. 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      Social Determinants of Health   Financial Resource Strain: Not on file  Food Insecurity: Not on file  Transportation Needs: Not on file  Physical Activity: Not on file  Stress: Not on file  Social Connections: Not on file     Family History: The patient's family history includes Ovarian cancer in his mother. There is no history of Colon cancer, Stomach cancer, Esophageal cancer, or Rectal cancer.  ROS:   Please see the history of present illness.     All other systems reviewed and are negative.  EKGs/Labs/Other Studies Reviewed:    The following studies were reviewed today:   EKG:   04/18/23: Sinus bradycardia, rate 59, LVH  Recent Labs:  No results found for requested labs within last 365 days.  Recent Lipid Panel No results found for: "CHOL", "TRIG", "HDL", "CHOLHDL", "VLDL", "LDLCALC", "LDLDIRECT"  Physical Exam:    VS:  BP 130/64 (BP Location: Right Arm, Patient Position: Sitting, Cuff Size: Normal)   Pulse (!) 59   Ht 5\' 11"  (1.803 m)   Wt 169 lb (76.7 kg)   SpO2 94%   BMI 23.57 kg/m     Wt Readings from Last 3 Encounters:  04/18/23 169 lb (76.7 kg)  09/01/22 168 lb (76.2 kg)  06/29/22 168 lb (76.2 kg)     GEN:  Well nourished, well developed in no acute distress HEENT: Normal NECK: No JVD; No carotid bruits LYMPHATICS: No lymphadenopathy CARDIAC:  RRR, no murmurs, rubs, gallops RESPIRATORY:  Clear to auscultation without rales, wheezing or rhonchi  ABDOMEN: Soft, non-tender, non-distended MUSCULOSKELETAL:  No edema; No deformity  SKIN: Warm and dry NEUROLOGIC:  Alert and oriented x 3 PSYCHIATRIC:  Normal affect   ASSESSMENT:    1. Coronary artery disease involving native coronary artery of native heart without angina pectoris   2. Hyperlipidemia, unspecified hyperlipidemia type   3. Nonspecific abnormal electrocardiogram (ECG) (EKG)   4. Elevated coronary artery calcium score    PLAN:    CAD: Underwent calcium score 12/2021, which was 497 (82nd percentile). -Continue Repatha, will check lipid panel -Continue aspirin -Ischemic evaluation recommended given calcium score above 400, will check exercise Myoview  Abnormal EKG: LVH on EKG, will check echocardiogram  Hyperlipidemia: LDL 161 on 01/24/2023.  Previous intolerance to statins.  Started on Repatha at that time.  Will check lipid panel  RTC in 6 months   Informed Consent   Shared Decision Making/Informed Consent The risks [chest pain, shortness of breath, cardiac arrhythmias, dizziness, blood pressure fluctuations, myocardial infarction, stroke/transient ischemic attack, nausea, vomiting, allergic reaction, radiation exposure, metallic taste sensation and life-threatening complications (estimated to be 1 in 10,000)], benefits (risk stratification, diagnosing coronary artery disease, treatment guidance) and alternatives of a nuclear stress test were discussed in detail with Mr. Wolinsky and he agrees to proceed.       Medication Adjustments/Labs and Tests Ordered: Current medicines are reviewed at length with the patient today.  Concerns regarding medicines are outlined above.  Orders Placed This Encounter  Procedures   Lipid panel   MYOCARDIAL PERFUSION IMAGING   EKG 12-Lead   ECHOCARDIOGRAM COMPLETE   No orders of the defined types were placed in this  encounter.   Patient Instructions  Medication Instructions:  Your physician recommends that you continue on your current medications as directed. Please refer to the Current Medication list given to you today.  *If you need a refill on your cardiac medications before your next appointment, please call your pharmacy*   Lab Work: Your physician recommends that you return for lab work in: the next week or 2 for FASTING Lipid panel  If you have labs (blood work) drawn today and your tests are completely normal, you will receive your results only by: MyChart Message (if you have MyChart) OR A paper copy in the mail If you have any lab test that is abnormal or we need to change your treatment, we will call you to review the results.   Testing/Procedures: Dr. Bjorn Pippin has ordered a Myocardial Perfusion Imaging Study.   The test will take approximately 3 to 4 hours to complete; you may bring reading material.  If someone comes with you to your appointment, they will need  to remain in the main lobby due to limited space in the testing area. **If you are pregnant or breastfeeding, please notify the nuclear lab prior to your appointment**  You will need to hold the following medications prior to your stress test: beta-blockers (24 hours prior to test)   How to prepare for your Myocardial Perfusion Test: Do not eat or drink 3 hours prior to your test, except you may have water. Do not consume products containing caffeine (regular or decaffeinated) 12 hours prior to your test. (ex: coffee, chocolate, sodas, tea). Do wear comfortable clothes (no dresses or overalls) and walking shoes, tennis shoes preferred (No heels or open toe shoes are allowed). Do NOT wear cologne, perfume, aftershave, or lotions (deodorant is allowed). If these instructions are not followed, your test will have to be rescheduled.  To do in January 2025.   Your physician has requested that you have an echocardiogram.  Echocardiography is a painless test that uses sound waves to create images of your heart. It provides your doctor with information about the size and shape of your heart and how well your heart's chambers and valves are working. This procedure takes approximately one hour. There are no restrictions for this procedure. Please do NOT wear cologne, perfume, aftershave, or lotions (deodorant is allowed). Please arrive 15 minutes prior to your appointment time. This will take place at [address] **To do in Janurary 2025**    Follow-Up: At [facility], you and your health needs are our priority.  As part of our continuing mission to provide you with exceptional heart care, we have created designated Provider Care Teams.  These Care Teams include your primary Cardiologist (physician) and Advanced Practice Providers (APPs -  Physician Assistants and Nurse Practitioners) who all work together to provide you with the care you need, when you need it.  We recommend signing up for the patient portal called "MyChart".  Sign up information is provided on this After Visit Summary.  MyChart is used to connect with patients for Virtual Visits (Telemedicine).  Patients are able to view lab/test results, encounter notes, upcoming appointments, etc.  Non-urgent messages can be sent to your provider as well.   To learn more about what you can do with MyChart, go to [email or web address].    Your next appointment:   6 month(s)  Provider:   Dr. Epifanio Lesches   Signed, Little Ishikawa, MD  04/18/2023 5:28 PM    [facility]

## 2023-04-18 ENCOUNTER — Ambulatory Visit: Payer: Medicare Other | Attending: Cardiology | Admitting: Cardiology

## 2023-04-18 ENCOUNTER — Encounter: Payer: Self-pay | Admitting: Cardiology

## 2023-04-18 VITALS — BP 130/64 | HR 59 | Ht 71.0 in | Wt 169.0 lb

## 2023-04-18 DIAGNOSIS — E785 Hyperlipidemia, unspecified: Secondary | ICD-10-CM | POA: Diagnosis present

## 2023-04-18 DIAGNOSIS — I2510 Atherosclerotic heart disease of native coronary artery without angina pectoris: Secondary | ICD-10-CM | POA: Insufficient documentation

## 2023-04-18 DIAGNOSIS — R931 Abnormal findings on diagnostic imaging of heart and coronary circulation: Secondary | ICD-10-CM | POA: Diagnosis present

## 2023-04-18 DIAGNOSIS — R9431 Abnormal electrocardiogram [ECG] [EKG]: Secondary | ICD-10-CM | POA: Insufficient documentation

## 2023-04-18 NOTE — Patient Instructions (Signed)
Medication Instructions:  Your physician recommends that you continue on your current medications as directed. Please refer to the Current Medication list given to you today.  *If you need a refill on your cardiac medications before your next appointment, please call your pharmacy*   Lab Work: Your physician recommends that you return for lab work in: the next week or 2 for FASTING Lipid panel  If you have labs (blood work) drawn today and your tests are completely normal, you will receive your results only by: MyChart Message (if you have MyChart) OR A paper copy in the mail If you have any lab test that is abnormal or we need to change your treatment, we will call you to review the results.   Testing/Procedures: Dr. Bjorn Pippin has ordered a Myocardial Perfusion Imaging Study.   The test will take approximately 3 to 4 hours to complete; you may bring reading material.  If someone comes with you to your appointment, they will need to remain in the main lobby due to limited space in the testing area. **If you are pregnant or breastfeeding, please notify the nuclear lab prior to your appointment**  You will need to hold the following medications prior to your stress test: beta-blockers (24 hours prior to test)   How to prepare for your Myocardial Perfusion Test: Do not eat or drink 3 hours prior to your test, except you may have water. Do not consume products containing caffeine (regular or decaffeinated) 12 hours prior to your test. (ex: coffee, chocolate, sodas, tea). Do wear comfortable clothes (no dresses or overalls) and walking shoes, tennis shoes preferred (No heels or open toe shoes are allowed). Do NOT wear cologne, perfume, aftershave, or lotions (deodorant is allowed). If these instructions are not followed, your test will have to be rescheduled.  To do in January 2025.   Your physician has requested that you have an echocardiogram. Echocardiography is a painless test that uses  sound waves to create images of your heart. It provides your doctor with information about the size and shape of your heart and how well your heart's chambers and valves are working. This procedure takes approximately one hour. There are no restrictions for this procedure. Please do NOT wear cologne, perfume, aftershave, or lotions (deodorant is allowed). Please arrive 15 minutes prior to your appointment time. This will take place at [address] [place] **To do in Janurary 2025**    Follow-Up: At [facility], you and your health needs are our priority.  As part of our continuing mission to provide you with exceptional heart care, we have created designated Provider Care Teams.  These Care Teams include your primary Cardiologist (physician) and Advanced Practice Providers (APPs -  Physician Assistants and Nurse Practitioners) who all work together to provide you with the care you need, when you need it.  We recommend signing up for the patient portal called "MyChart".  Sign up information is provided on this After Visit Summary.  MyChart is used to connect with patients for Virtual Visits (Telemedicine).  Patients are able to view lab/test results, encounter notes, upcoming appointments, etc.  Non-urgent messages can be sent to your provider as well.   To learn more about what you can do with MyChart, go to [email or web address].    Your next appointment:   6 month(s)  Provider:   Dr. Epifanio Lesches

## 2023-05-01 LAB — LIPID PANEL
Chol/HDL Ratio: 2.4 ratio (ref 0.0–5.0)
Cholesterol, Total: 141 mg/dL (ref 100–199)
HDL: 59 mg/dL (ref >39)
LDL Chol Calc (NIH): 68 mg/dL (ref 0–99)
Triglycerides: 72 mg/dL (ref 0–149)
VLDL Cholesterol Cal: 14 mg/dL (ref 5–40)

## 2023-06-22 ENCOUNTER — Encounter (HOSPITAL_COMMUNITY): Payer: Self-pay

## 2023-06-29 ENCOUNTER — Ambulatory Visit (HOSPITAL_COMMUNITY): Payer: Medicare Other | Attending: Cardiovascular Disease

## 2023-06-29 ENCOUNTER — Ambulatory Visit (HOSPITAL_BASED_OUTPATIENT_CLINIC_OR_DEPARTMENT_OTHER): Payer: Medicare Other

## 2023-06-29 DIAGNOSIS — R931 Abnormal findings on diagnostic imaging of heart and coronary circulation: Secondary | ICD-10-CM | POA: Diagnosis not present

## 2023-06-29 DIAGNOSIS — R9431 Abnormal electrocardiogram [ECG] [EKG]: Secondary | ICD-10-CM | POA: Diagnosis present

## 2023-06-29 DIAGNOSIS — I2510 Atherosclerotic heart disease of native coronary artery without angina pectoris: Secondary | ICD-10-CM

## 2023-06-29 DIAGNOSIS — E785 Hyperlipidemia, unspecified: Secondary | ICD-10-CM

## 2023-06-29 LAB — MYOCARDIAL PERFUSION IMAGING
Angina Index: 0
Duke Treadmill Score: 0
Estimated workload: 12.1
Exercise duration (min): 10 min
Exercise duration (sec): 15 s
LV dias vol: 108 mL (ref 62–150)
LV sys vol: 54 mL
MPHR: 153 {beats}/min
Nuc Stress EF: 50 %
Peak HR: 136 {beats}/min
Percent HR: 88 %
Rest HR: 47 {beats}/min
Rest Nuclear Isotope Dose: 10.4 mCi
SDS: 0
SRS: 0
SSS: 0
ST Depression (mm): 2 mm
Stress Nuclear Isotope Dose: 32.6 mCi
TID: 0.97

## 2023-06-29 LAB — ECHOCARDIOGRAM COMPLETE
Area-P 1/2: 3.17 cm2
Height: 71 in
S' Lateral: 2.4 cm
Weight: 2704 [oz_av]

## 2023-06-29 MED ORDER — TECHNETIUM TC 99M TETROFOSMIN IV KIT
32.6000 | PACK | Freq: Once | INTRAVENOUS | Status: AC | PRN
  Administered 2023-06-29: 32.6 via INTRAVENOUS

## 2023-06-29 MED ORDER — TECHNETIUM TC 99M TETROFOSMIN IV KIT
10.4000 | PACK | Freq: Once | INTRAVENOUS | Status: AC | PRN
  Administered 2023-06-29: 10.4 via INTRAVENOUS
